# Patient Record
Sex: Male | Born: 1945
Health system: Southern US, Community
[De-identification: ages and names within clinical notes are randomized; demographics above are authoritative.]

## PROBLEM LIST (undated history)

## (undated) DIAGNOSIS — B20 Human immunodeficiency virus [HIV] disease: Secondary | ICD-10-CM

## (undated) DIAGNOSIS — I10 Essential (primary) hypertension: Secondary | ICD-10-CM

## (undated) DIAGNOSIS — E119 Type 2 diabetes mellitus without complications: Secondary | ICD-10-CM

## (undated) DIAGNOSIS — I639 Cerebral infarction, unspecified: Secondary | ICD-10-CM

## (undated) DIAGNOSIS — T8859XA Other complications of anesthesia, initial encounter: Secondary | ICD-10-CM

## (undated) DIAGNOSIS — D649 Anemia, unspecified: Secondary | ICD-10-CM

## (undated) DIAGNOSIS — R7303 Prediabetes: Secondary | ICD-10-CM

## (undated) DIAGNOSIS — C7931 Secondary malignant neoplasm of brain: Secondary | ICD-10-CM

## (undated) DIAGNOSIS — F17290 Nicotine dependence, other tobacco product, uncomplicated: Secondary | ICD-10-CM

## (undated) DIAGNOSIS — I719 Aortic aneurysm of unspecified site, without rupture: Secondary | ICD-10-CM

## (undated) DIAGNOSIS — M1990 Unspecified osteoarthritis, unspecified site: Secondary | ICD-10-CM

## (undated) DIAGNOSIS — C801 Malignant (primary) neoplasm, unspecified: Secondary | ICD-10-CM

## (undated) DIAGNOSIS — S68111A Complete traumatic metacarpophalangeal amputation of left index finger, initial encounter: Secondary | ICD-10-CM

## (undated) DIAGNOSIS — E782 Mixed hyperlipidemia: Secondary | ICD-10-CM

## (undated) DIAGNOSIS — F419 Anxiety disorder, unspecified: Secondary | ICD-10-CM

## (undated) DIAGNOSIS — K219 Gastro-esophageal reflux disease without esophagitis: Secondary | ICD-10-CM

## (undated) DIAGNOSIS — R918 Other nonspecific abnormal finding of lung field: Secondary | ICD-10-CM

## (undated) DIAGNOSIS — J449 Chronic obstructive pulmonary disease, unspecified: Secondary | ICD-10-CM

## (undated) HISTORY — DX: Malignant (primary) neoplasm, unspecified: C80.1

## (undated) HISTORY — PX: TONSILLECTOMY: SUR1361

## (undated) HISTORY — PX: LUMBAR DISC SURGERY: SHX700

## (undated) HISTORY — DX: Type 2 diabetes mellitus without complications: E11.9

## (undated) HISTORY — PX: COLONOSCOPY: SHX174

## (undated) HISTORY — PX: CERVICAL SPINE SURGERY: SHX589

## (undated) MED ORDER — HEPARIN SODIUM (PORCINE) 5000 UNIT/ML IJ SOLN
5000.00 [IU] | Freq: Three times a day (TID) | INTRAMUSCULAR | Status: AC
Start: 2017-12-07 — End: ?

---

## 2003-02-20 ENCOUNTER — Other Ambulatory Visit (INDEPENDENT_AMBULATORY_CARE_PROVIDER_SITE_OTHER): Payer: Self-pay | Admitting: Emergency Medicine

## 2003-02-20 LAB — URINALYSIS
Ketones: NEGATIVE
Nitrite: NEGATIVE
Specific Gravity: 1.013 (ref 1.002–1.030)
Urobilinogen: 0.2 (ref 0.2–?)
pH: 5.5 (ref 5.0–8.0)

## 2017-07-21 DIAGNOSIS — I639 Cerebral infarction, unspecified: Secondary | ICD-10-CM

## 2017-07-21 HISTORY — DX: Cerebral infarction, unspecified (CMS-HCC): I63.9

## 2017-09-19 ENCOUNTER — Other Ambulatory Visit: Payer: Self-pay

## 2017-09-19 DIAGNOSIS — R200 Anesthesia of skin: Secondary | ICD-10-CM

## 2017-09-30 ENCOUNTER — Encounter: Payer: Self-pay | Admitting: Vascular Surgery

## 2017-09-30 ENCOUNTER — Ambulatory Visit (HOSPITAL_COMMUNITY)
Admission: RE | Admit: 2017-09-30 | Discharge: 2017-09-30 | Disposition: A | Discharge status: Home / Self Care | Payer: Medicare Other | Source: Ambulatory Visit | Attending: Vascular Surgery | Admitting: Vascular Surgery

## 2017-09-30 ENCOUNTER — Ambulatory Visit: Payer: Medicare Other | Admitting: Vascular Surgery

## 2017-09-30 VITALS — BP 151/73 | HR 58 | Temp 97.6°F | Resp 16 | Ht 69.0 in | Wt 167.0 lb

## 2017-09-30 DIAGNOSIS — R200 Anesthesia of skin: Secondary | ICD-10-CM | POA: Insufficient documentation

## 2017-09-30 NOTE — Progress Notes (Signed)
[facility] [place]  Patient name: Nathan Turner MRN: [id number] DOB: 08-Jul-1945 Sex: male  REASON FOR CONSULT: Evaluate lower extremity numbness and aching  HPI: Nathan Turner is a 72 y.o. male, who is here today for evaluation of aching and numbness in his lower extremities.  He reports that this is in his calves and is a 2-4 on a scale of 1-10 of discomfort.  This is not related to exercise.  He reports this can occur while he is sitting standing or lying down or he when he is walking.  He reports this been present for several months.  He did report an episode earlier of clearing brush and had a back injury associated with this which was improved with chiropractic manipulation.  Past Medical History:  Diagnosis Date  . Cancer ([place])   . Diabetes mellitus without complication ([place])     History reviewed. No pertinent family history.  SOCIAL HISTORY: Social History   Socioeconomic History  . Marital status: Married    Spouse name: Not on file  . Number of children: Not on file  . Years of education: Not on file  . Highest education level: Not on file  Occupational History  . Not on file  Social Needs  . Financial resource strain: Not on file  . Food insecurity:    Worry: Not on file    Inability: Not on file  . Transportation needs:    Medical: Not on file    Non-medical: Not on file  Tobacco Use  . Smoking status: Current Every Day Smoker    Packs/day: 1.00    Years: 64.00    Pack years: 64.00    Types: Cigarettes  . Smokeless tobacco: Never Used  Substance and Sexual Activity  . Alcohol use: Never    Frequency: Never  . Drug use: Never  . Sexual activity: Yes  Lifestyle  . Physical activity:    Days per week: Not on file    Minutes per session: Not on file  . Stress: Not on file  Relationships  . Social connections:    Talks on phone: Not on file    Gets together: Not on file    Attends  religious service: Not on file    Active member of club or organization: Not on file    Attends meetings of clubs or organizations: Not on file    Relationship status: Not on file  . Intimate partner violence:    Fear of current or ex partner: Not on file    Emotionally abused: Not on file    Physically abused: Not on file    Forced sexual activity: Not on file  Other Topics Concern  . Not on file  Social History Narrative  . Not on file    No Known Allergies  Current Outpatient Medications  Medication Sig Dispense Refill  . aspirin EC 81 MG tablet Take 81 mg by mouth daily.    . citalopram (CELEXA) 20 MG tablet Take 20 mg by mouth daily.    Marland Kitchen diltiazem (CARDIZEM) 120 MG tablet Take 120 mg by mouth 1 day or 1 dose.    . lovastatin (MEVACOR) 20 MG tablet Take 20 mg by mouth every morning.    Marland Kitchen rOPINIRole (REQUIP) 1 MG tablet Take 1 mg by mouth at bedtime.    . temazepam (RESTORIL) 15 MG capsule Take 15 mg by mouth at bedtime as needed for sleep.    Marland Kitchen  traMADol (ULTRAM) 50 MG tablet Take by mouth every 6 (six) hours as needed.     No current facility-administered medications for this visit.     REVIEW OF SYSTEMS:  [X]  denotes positive finding, [ ]  denotes negative finding Cardiac  Comments:  Chest pain or chest pressure:    Shortness of breath upon exertion: x   Short of breath when lying flat:    Irregular heart rhythm:        Vascular    Pain in calf, thigh, or hip brought on by ambulation: x   Pain in feet at night that wakes you up from your sleep:  x   Blood clot in your veins:    Leg swelling:         Pulmonary    Oxygen at home:    Productive cough:     Wheezing:         Neurologic    Sudden weakness in arms or legs:  x   Sudden numbness in arms or legs:  x   Sudden onset of difficulty speaking or slurred speech:    Temporary loss of vision in one eye:  x   Problems with dizziness:  x       Gastrointestinal    Blood in stool:     Vomited blood:           Genitourinary    Burning when urinating:     Blood in urine:        Psychiatric    Major depression:         Hematologic    Bleeding problems:    Problems with blood clotting too easily:        Skin    Rashes or ulcers:        Constitutional    Fever or chills:      PHYSICAL EXAM: Vitals:   09/30/17 1157 09/30/17 1158  BP: (!) 153/85 (!) 151/73  Pulse: (!) 58   Resp: 16   Temp: 97.6 F (36.4 C)   SpO2: 96%   Weight: 167 lb (75.8 kg)   Height: 5\' 9"  (1.753 m)     GENERAL: The patient is a well-nourished male, in no acute distress. The vital signs are documented above. CARDIOVASCULAR: 2+ radial and 2+ dorsalis pedis pulses bilaterally. PULMONARY: There is good air exchange  ABDOMEN: Soft and non-tender  MUSCULOSKELETAL: There are no major deformities or cyanosis. NEUROLOGIC: No focal weakness or paresthesias are detected. SKIN: There are no ulcers or rashes noted. PSYCHIATRIC: The patient has a normal affect.  DATA:  Noninvasive studies were reviewed with the patient and his wife present.  This shows triphasic waveforms in the posterior tibial and dorsalis pedis bilaterally.  He has normal ankle arm index bilaterally  MEDICAL ISSUES: Discussed the clinic with the patient.  I am unclear as to the cause of his lower extremity discomfort.  I explained that he has no evidence of arterial insufficiency to explain this.  He was relieved with this discussion and will continue his usual activities.  We will seek further evaluation if this persists.  Does appear more neurologic than orthopedic.   Rosetta Posner, MD FACS [facility] Tel [phone] Pager [phone]

## 2017-12-07 ENCOUNTER — Emergency Department (HOSPITAL_COMMUNITY)

## 2017-12-07 ENCOUNTER — Emergency Department (HOSPITAL_BASED_OUTPATIENT_CLINIC_OR_DEPARTMENT_OTHER)

## 2017-12-07 ENCOUNTER — Encounter (HOSPITAL_COMMUNITY): Payer: Self-pay

## 2017-12-07 ENCOUNTER — Inpatient Hospital Stay
Admission: EM | Admit: 2017-12-07 | Discharge: 2017-12-09 | DRG: 683 | Disposition: A | Discharge status: Other Acute Inpatient Hospital | Attending: Hospitalist | Admitting: Hospitalist

## 2017-12-07 DIAGNOSIS — R531 Weakness: Secondary | ICD-10-CM

## 2017-12-07 DIAGNOSIS — L89219 Pressure ulcer of right hip, unspecified stage: Secondary | ICD-10-CM | POA: Diagnosis present

## 2017-12-07 DIAGNOSIS — K5900 Constipation, unspecified: Secondary | ICD-10-CM

## 2017-12-07 DIAGNOSIS — M4609 Spinal enthesopathy, multiple sites in spine: Secondary | ICD-10-CM

## 2017-12-07 DIAGNOSIS — Z8673 Personal history of transient ischemic attack (TIA), and cerebral infarction without residual deficits: Secondary | ICD-10-CM

## 2017-12-07 DIAGNOSIS — M6282 Rhabdomyolysis: Secondary | ICD-10-CM | POA: Diagnosis present

## 2017-12-07 DIAGNOSIS — B964 Proteus (mirabilis) (morganii) as the cause of diseases classified elsewhere: Secondary | ICD-10-CM | POA: Diagnosis present

## 2017-12-07 DIAGNOSIS — N400 Enlarged prostate without lower urinary tract symptoms: Secondary | ICD-10-CM | POA: Diagnosis present

## 2017-12-07 DIAGNOSIS — B379 Candidiasis, unspecified: Secondary | ICD-10-CM | POA: Diagnosis present

## 2017-12-07 DIAGNOSIS — L89319 Pressure ulcer of right buttock, unspecified stage: Secondary | ICD-10-CM | POA: Diagnosis present

## 2017-12-07 DIAGNOSIS — W01198A Fall on same level from slipping, tripping and stumbling with subsequent striking against other object, initial encounter: Secondary | ICD-10-CM | POA: Diagnosis present

## 2017-12-07 DIAGNOSIS — Z794 Long term (current) use of insulin: Secondary | ICD-10-CM

## 2017-12-07 DIAGNOSIS — Z87891 Personal history of nicotine dependence: Secondary | ICD-10-CM

## 2017-12-07 DIAGNOSIS — Z9181 History of falling: Secondary | ICD-10-CM

## 2017-12-07 DIAGNOSIS — R9082 White matter disease, unspecified: Secondary | ICD-10-CM

## 2017-12-07 DIAGNOSIS — Z0389 Encounter for observation for other suspected diseases and conditions ruled out: Secondary | ICD-10-CM

## 2017-12-07 DIAGNOSIS — Y92000 Kitchen of unspecified non-institutional (private) residence as  the place of occurrence of the external cause: Secondary | ICD-10-CM

## 2017-12-07 DIAGNOSIS — R7881 Bacteremia: Secondary | ICD-10-CM | POA: Diagnosis present

## 2017-12-07 DIAGNOSIS — N179 Acute kidney failure, unspecified: Principal | ICD-10-CM | POA: Diagnosis present

## 2017-12-07 DIAGNOSIS — W19XXXA Unspecified fall, initial encounter: Secondary | ICD-10-CM

## 2017-12-07 DIAGNOSIS — B20 Human immunodeficiency virus [HIV] disease: Secondary | ICD-10-CM | POA: Diagnosis present

## 2017-12-07 DIAGNOSIS — E860 Dehydration: Secondary | ICD-10-CM | POA: Diagnosis present

## 2017-12-07 DIAGNOSIS — I10 Essential (primary) hypertension: Secondary | ICD-10-CM | POA: Diagnosis present

## 2017-12-07 DIAGNOSIS — E118 Type 2 diabetes mellitus with unspecified complications: Secondary | ICD-10-CM

## 2017-12-07 DIAGNOSIS — S80211A Abrasion, right knee, initial encounter: Secondary | ICD-10-CM | POA: Diagnosis present

## 2017-12-07 DIAGNOSIS — I69351 Hemiplegia and hemiparesis following cerebral infarction affecting right dominant side: Secondary | ICD-10-CM

## 2017-12-07 DIAGNOSIS — L89159 Pressure ulcer of sacral region, unspecified stage: Secondary | ICD-10-CM | POA: Diagnosis present

## 2017-12-07 DIAGNOSIS — S80212A Abrasion, left knee, initial encounter: Secondary | ICD-10-CM

## 2017-12-07 DIAGNOSIS — R627 Adult failure to thrive: Secondary | ICD-10-CM | POA: Diagnosis present

## 2017-12-07 DIAGNOSIS — M4186 Other forms of scoliosis, lumbar region: Secondary | ICD-10-CM

## 2017-12-07 DIAGNOSIS — M5137 Other intervertebral disc degeneration, lumbosacral region: Secondary | ICD-10-CM

## 2017-12-07 DIAGNOSIS — M549 Dorsalgia, unspecified: Secondary | ICD-10-CM

## 2017-12-07 DIAGNOSIS — G9389 Other specified disorders of brain: Secondary | ICD-10-CM

## 2017-12-07 DIAGNOSIS — E11622 Type 2 diabetes mellitus with other skin ulcer: Secondary | ICD-10-CM | POA: Diagnosis present

## 2017-12-07 HISTORY — DX: Human immunodeficiency virus (HIV) disease (CMS-HCC): B20

## 2017-12-07 HISTORY — DX: Essential (primary) hypertension: I10

## 2017-12-07 HISTORY — DX: Cerebral infarction, unspecified (CMS-HCC): I63.9

## 2017-12-07 HISTORY — DX: Type 2 diabetes mellitus without complications (CMS-HCC): E11.9

## 2017-12-07 LAB — CBC WITH DIFF, BLOOD
ANC-Automated: 5.9 10*3/uL (ref 1.6–7.0)
Abs Basophils: 0 10*3/uL (ref <0.1)
Abs Eosinophils: 0.1 10*3/uL (ref 0.1–0.5)
Abs Lymphs: 1.1 10*3/uL (ref 0.8–3.1)
Abs Monos: 0.6 10*3/uL (ref 0.2–0.8)
Basophils: 0 %
Eosinophils: 1 %
Hct: 57.3 % — ABNORMAL HIGH (ref 40.0–50.0)
Hgb: 19.6 gm/dL (ref 13.7–17.5)
Lymphocytes: 15 %
MCH: 33.4 pg — ABNORMAL HIGH (ref 26.0–32.0)
MCHC: 34.2 g/dL (ref 32.0–36.0)
MCV: 97.8 um3 — ABNORMAL HIGH (ref 79.0–95.0)
MPV: 11.3 fL (ref 9.4–12.4)
Monocytes: 7 %
Plt Count: 107 10*3/uL — ABNORMAL LOW (ref 140–370)
RBC: 5.86 10*6/uL (ref 4.60–6.10)
RDW: 12.7 % (ref 12.0–14.0)
Segs: 77 %
WBC: 7.7 10*3/uL (ref 4.0–10.0)

## 2017-12-07 LAB — URINALYSIS WITH CULTURE REFLEX, WHEN INDICATED
Bilirubin: NEGATIVE
Glucose: NEGATIVE
Leuk Esterase: NEGATIVE
Nitrite: NEGATIVE
Protein: NEGATIVE
Specific Gravity: 1.023 (ref 1.002–1.030)
pH: 6 (ref 5.0–8.0)

## 2017-12-07 LAB — CPK-CREATINE PHOSPHOKINASE, BLOOD
CPK: 8679 U/L (ref 0–175)
CPK: 7749 U/L (ref 0–175)

## 2017-12-07 LAB — COMPREHENSIVE METABOLIC PANEL, BLOOD
ALT (SGPT): 90 U/L — ABNORMAL HIGH (ref 0–41)
AST (SGOT): 214 U/L — ABNORMAL HIGH (ref 0–40)
Albumin: 4.2 g/dL (ref 3.5–5.2)
Alkaline Phos: 106 U/L (ref 40–129)
Anion Gap: 15 mmol/L (ref 7–15)
BUN: 62 mg/dL — ABNORMAL HIGH (ref 8–23)
Bicarbonate: 30 mmol/L — ABNORMAL HIGH (ref 22–29)
Bilirubin, Tot: 2.26 mg/dL — ABNORMAL HIGH (ref <1.2)
Calcium: 10.2 mg/dL (ref 8.5–10.6)
Chloride: 100 mmol/L (ref 98–107)
Creatinine: 1.53 mg/dL — ABNORMAL HIGH (ref 0.67–1.17)
GFR: 54 mL/min
Glucose: 196 mg/dL — ABNORMAL HIGH (ref 70–99)
Potassium: 4.8 mmol/L (ref 3.5–5.1)
Sodium: 145 mmol/L (ref 136–145)
Total Protein: 10 g/dL — ABNORMAL HIGH (ref 6.0–8.0)

## 2017-12-07 LAB — PRO BNP, BLOOD: BNPP: 69 pg/mL (ref 0–899)

## 2017-12-07 LAB — TROPONIN T GEN 5 W/REFLEX TO CK/CKMB
Troponin T Gen 5 w/Reflex CK/CKMB: 28 ng/L — ABNORMAL HIGH (ref <22)
Troponin T Gen 5 w/Reflex CK/CKMB: 23 ng/L — ABNORMAL HIGH (ref <22)

## 2017-12-07 LAB — CKMB+INDEX (NO CPK), BLOOD
CK-MB Index: 0.3 %
CK-MB Index: 0.2 %
CK-MB: 30 ng/mL — ABNORMAL HIGH (ref 0.0–4.8)
CK-MB: 16 ng/mL — ABNORMAL HIGH (ref 0.0–4.8)

## 2017-12-07 LAB — HCV ANTIBODY WITH REFLEX QUANT: Hepatitis C Ab: NONREACTIVE

## 2017-12-07 LAB — MAGNESIUM, BLOOD: Magnesium: 3.3 mg/dL — ABNORMAL HIGH (ref 1.6–2.4)

## 2017-12-07 LAB — PHOSPHORUS, BLOOD: Phosphorous: 4.7 mg/dL — ABNORMAL HIGH (ref 2.7–4.5)

## 2017-12-07 LAB — LACTATE, BLOOD
Lactate: 2.2 mmol/L — ABNORMAL HIGH (ref 0.5–2.0)
Lactate: 3.1 mmol/L — ABNORMAL HIGH (ref 0.5–2.0)

## 2017-12-07 LAB — PROTHROMBIN TIME, BLOOD
INR: 1.1
PT,Patient: 12.4 s (ref 9.7–12.5)

## 2017-12-07 LAB — GLUCOSE (POCT): Glucose (POCT): 202 mg/dL — ABNORMAL HIGH (ref 70–99)

## 2017-12-07 LAB — APTT, BLOOD: PTT: 28 s (ref 25–34)

## 2017-12-07 MED ORDER — NALOXONE HCL 0.4 MG/ML IJ SOLN
0.1000 mg | INTRAMUSCULAR | Status: DC | PRN
Start: 2017-12-07 — End: 2017-12-09

## 2017-12-07 MED ORDER — SODIUM CHLORIDE 0.9 % IV SOLN
INTRAVENOUS | Status: AC
Start: 2017-12-07 — End: 2017-12-08
  Administered 2017-12-07: 20:00:00 via INTRAVENOUS

## 2017-12-07 MED ORDER — NYSTATIN 100000 UNIT/ML MT SUSP
5.0000 mL | Freq: Four times a day (QID) | OROMUCOSAL | Status: DC
Start: 2017-12-07 — End: 2017-12-09
  Administered 2017-12-07 – 2017-12-09 (×7): 500000 [IU] via ORAL
  Filled 2017-12-07 (×7): qty 5

## 2017-12-07 MED ORDER — SODIUM CHLORIDE 0.9% TKO INFUSION
INTRAVENOUS | Status: DC | PRN
Start: 2017-12-07 — End: 2017-12-09

## 2017-12-07 MED ORDER — SODIUM CHLORIDE 0.9 % IJ SOLN (CUSTOM)
3.0000 mL | Freq: Three times a day (TID) | INTRAMUSCULAR | Status: DC
Start: 2017-12-07 — End: 2017-12-09
  Administered 2017-12-08 – 2017-12-09 (×5): 3 mL via INTRAVENOUS

## 2017-12-07 MED ORDER — LACTATED RINGERS IV SOLN
Freq: Once | INTRAVENOUS | Status: AC
Start: 2017-12-07 — End: 2017-12-07
  Administered 2017-12-07: 17:00:00 via INTRAVENOUS

## 2017-12-07 MED ORDER — DEXTROSE (DIABETIC USE) 40 % OR GEL
1.0000 | ORAL | Status: DC | PRN
Start: 2017-12-07 — End: 2017-12-09

## 2017-12-07 MED ORDER — INSULIN LISPRO (HUMAN) 100 UNIT/ML SC SOLN (CUSTOM)
1.0000 [IU] | Freq: Three times a day (TID) | INTRAMUSCULAR | Status: DC
Start: 2017-12-07 — End: 2017-12-09
  Administered 2017-12-07 – 2017-12-08 (×3): 1 [IU] via SUBCUTANEOUS
  Administered 2017-12-08 – 2017-12-09 (×2): 2 [IU] via SUBCUTANEOUS
  Filled 2017-12-07 (×3): qty 1
  Filled 2017-12-07 (×2): qty 2

## 2017-12-07 MED ORDER — GLUCOSE 4 GM PO CHEW (CUSTOM)
4.0000 | CHEWABLE_TABLET | ORAL | Status: DC | PRN
Start: 2017-12-07 — End: 2017-12-09

## 2017-12-07 MED ORDER — LACTATED RINGERS IV SOLN
Freq: Once | INTRAVENOUS | Status: AC
Start: 2017-12-07 — End: 2017-12-07
  Administered 2017-12-07: 15:00:00 via INTRAVENOUS

## 2017-12-07 MED ORDER — SENNA 8.6 MG OR TABS
2.0000 | ORAL_TABLET | Freq: Every evening | ORAL | Status: DC
Start: 2017-12-07 — End: 2017-12-09
  Filled 2017-12-07 (×2): qty 2

## 2017-12-07 MED ORDER — HEPARIN SODIUM (PORCINE) 5000 UNIT/ML IJ SOLN
5000.0000 [IU] | Freq: Three times a day (TID) | INTRAMUSCULAR | Status: DC
Start: 2017-12-07 — End: 2017-12-09
  Administered 2017-12-07 – 2017-12-09 (×7): 5000 [IU] via SUBCUTANEOUS
  Filled 2017-12-07 (×6): qty 1

## 2017-12-07 MED ORDER — HEPARIN SODIUM (PORCINE) 5000 UNIT/ML IJ SOLN
5000.0000 [IU] | Freq: Two times a day (BID) | INTRAMUSCULAR | Status: DC
Start: 2017-12-07 — End: 2017-12-07

## 2017-12-07 MED ORDER — POLYETHYLENE GLYCOL 3350 OR PACK
17.0000 g | PACK | ORAL | Status: DC | PRN
Start: 2017-12-07 — End: 2017-12-09

## 2017-12-07 MED ORDER — DEXTROSE 50 % IV SOLN
12.5000 g | INTRAVENOUS | Status: DC | PRN
Start: 2017-12-07 — End: 2017-12-09

## 2017-12-07 MED ORDER — TRAMADOL HCL 50 MG OR TABS
50.0000 mg | ORAL_TABLET | ORAL | Status: DC | PRN
Start: 2017-12-07 — End: 2017-12-09

## 2017-12-07 MED ORDER — LACTATED RINGERS IV SOLN
Freq: Once | INTRAVENOUS | Status: AC
Start: 2017-12-07 — End: 2017-12-07
  Administered 2017-12-07: 12:00:00 via INTRAVENOUS

## 2017-12-07 MED ORDER — SODIUM CHLORIDE 0.9 % IJ SOLN (CUSTOM)
3.0000 mL | INTRAMUSCULAR | Status: DC | PRN
Start: 2017-12-07 — End: 2017-12-09

## 2017-12-07 MED ORDER — GLUCAGON HCL (RDNA) 1 MG IJ SOLR
1.0000 mg | Freq: Once | INTRAMUSCULAR | Status: DC | PRN
Start: 2017-12-07 — End: 2017-12-09

## 2017-12-07 NOTE — ED Notes (Signed)
Pt drank 3-glasses of water & eating a sandwich.

## 2017-12-07 NOTE — H&P (Signed)
Medicine Admission Note    Patient Name: Brendan Huerta        MRN: [id number]  Admitted: 12/07/2017 11:17 AM     Patient ID: Brendan Huerta is a 72 year old male with a PMH significant for DM2, HTN, CVAx3 (07/2017 most recent), and HIV (unknown CD4 and Viral Load) who presents with likely rhabdomyolysis and AKI d/t severe volume depletion after a ground level fall without food or drink for 1 week, right hip and buttocks pressure ulcers.     CC:  Severe Volume Depletion causing AKI and Rhabdomyolysis     HPI:      Patient reports that he was trying to get food from the fridge when he felt dizzy, tripped, and fell about 7-8 days ago, he believes it was last Saturday (8/10). He reports hitting his head on the way down. After falling, he tried to crawl to his door but was unable to. The patient's neighbor noticed his mail building up, became concerned, went to check on him and found him down. Eemergency responders found the patient down in his home lying in urine. The patient reports similar dizzy symptoms about once per month.    The patient reports a history of 3 strokes, the most recent in April 2019, with residual right sided weakness. The patient uses a cane to ambulate outside the house, he was not using a cane at the time of his fall.  He lives alone without a caregiver and receives VA benefits that covers his cost of living.    History limited by memory and concentration        ED events:  Fluid Resuscitation: 3L LRs  CT Head: No acute findings   CXR: No consolidation, effusion, or signs of trauma  U/A: Positive for trace ketones, 2+ blood and 2+ Urobilinogen   EKG: Nonspecific ST Changes   Labs: See below     General ROS:   General: Mild Headache, denies fevers, chills, night sweats, recent weight change  Eyes: Denies blurry vision, eye pain  Ears: Denies ringing in ears, ear pain  Cardio: Denies chest pain, palpitations, orthopnea,   Pulm: Reports SOB and cough. Denies hemoptysis,   GI: Reports blood in stool  2-3/month surround the stool. Unsure if there is blood on the toilet paper. Last BM last Tuesday. Denies dysphagia, n/v/d/c, melena.   GU: Reports hematuria 1 time/month, polyuria. Denies dysuria, nocturia (1/night),    Neuro: See H&P. Denies numbness and tingling in extremities.     Past Medical and Surgical History:  Past Medical History:   Diagnosis Date   . CVA (cerebral vascular accident) (CMS-HCC) 07/2017    Left sided stroke, right motor dysfunction    . CVA (cerebral vascular accident) (CMS-HCC)     3 total   . DM2 (diabetes mellitus, type 2) (CMS-HCC)    . HIV disease (CMS-HCC)    . HTN (hypertension)      No past surgical history on file.    Social History: Patient lives by himself without a caregiver  Social History     Socioeconomic History   . Marital status: Divorced     Spouse name: Not on file   . Number of children: 1   . Years of education: Not on file   . Highest education level: Not on file   Occupational History   . Occupation: Former Building control surveyor   Tobacco Use   . Smoking status: Former Smoker     Packs/day: 4.00  Years: 37.00     Pack years: 148.00     Types: Cigarettes     Last attempt to quit: 1997     Years since quitting: 22.6   . Smokeless tobacco: Never Used   Substance and Sexual Activity   . Alcohol use: Not Currently     Frequency: 4 or more times a week     Drinks per session: 10 or more     Comment: Stopped drinking in 1996   . Drug use: Not Currently     Comment: Former marijuana, cocaine, crystal meth    . Sexual activity: Not Currently     Comment: Formerly sexually active with women   Social Activities of Daily Living Present   . Military Service Yes   . Blood Transfusions Not Asked   . Caffeine Concern Yes   . Occupational Exposure Not Asked   . Hobby Hazards Not Asked   . Sleep Concern No   . Stress Concern Not Asked   . Weight Concern Not Asked   . Special Diet Not Asked   . Back Care Not Asked   . Exercises Regularly Not Asked   . Bike Helmet Use Not Asked   . Seat Belt Use Not  Asked   . Performs Self-Exams Not Asked   Social History Narrative   . Not on file       Family History:   Patient was unsure of his family medical history    Medications:  Patient is unsure of his home medications    Allergies:  Patient is unsure of his allergies   ---------------------------------------------------------------------------------------------------  Objective:  Vitals:  BP  Min: 129/78  Max: 177/81  Temp  Min: 97.3 F (36.3 C)  Max: 98.2 F (36.8 C)  Pulse  Min: 90  Max: 105  Resp  Min: 16  Max: 22  SpO2  Min: 90 %  Max: 100 %  Weight  Min: 92.4 kg (203 lb 12.8 oz)  Max: 92.4 kg (203 lb 12.8 oz)    Weights (last 14 days)     Date/Time Weight Weight Source Percentage Weight Change (%) Who    12/07/17 1128  92.4 kg (203 lb 12.8 oz)  Bed scale  0 % AE            Previous dry weight:     Physical Exam:  Gen: Patient lying in bed comfortably, NAD, difficulty staying focused on conversation, cooperative  HEENT: Hazy Cataracts, injected sclera, PERRL 3->2, EOMI, OP   Neck: Supple, no LAD, JVP flat  CV:  Tachycardic and regular rhythm, no murmurs, clicks, or gallops.  Resp: Patient could not sit up without pain, clear to auscultation of lateral and anterior lung fields, normal work of breathing, shallow breaths.  Abdomen: BS normal.  Abdomen soft, non-tender.  No masses or organomegaly appreciated.  Extremities:  No lower extremity edema, warm and well perfused, some skin turgor  Neuro: A&Ox3 (Knew month and year, thought it was Saturday did not know number date). CN II-XII intact.  RUE 4/5 strength with shoulder Abduction, flexion/extension at elbow.   LUE 4+/5 Strength with shoulder abduction, flexion/extension at elbow  Grip strength roughly symmetric  LE neuro exam limited by pain.  RLE 2+/5 strength hip flexors, could not test knee d/t pain, able to see muscles of quads contracting, minimally able to wiggle toes   LLE 3/5 strength hip flexors, minimal antigravity, able to see muscle of quads  contracting, able to wiggle toes  and dorsiflex.  Right Biceps reflex 3+, L biceps reflex 2+. Differed LE reflexes d/t pain. Hoffman positive RUE, negative on left. Babinski on right leg limited by plantar pain.  Crude sensory exam equivalent bilaterally.     Pertinent labs (see EPIC for complete list):     Recent Labs     12/07/17  1206   NA 145   K 4.8   CL 100   BICARB 30*   BUN 62*   CREAT 1.53*   [place] 10.2   MG 3.3*   PHOS 4.7*   TP 10.0*   ALB 4.2   TBILI 2.26*   AST 214*   ALT 90*   ALK 106       Recent Labs     12/07/17  1206   WBC 7.7   HGB 19.6*   HCT 57.3*   MCV 97.8*   PLT 107*   SEG 77   LYMPHS 15   MONOS 7   EOS 1       Recent Labs     12/07/17  1206   PTT 28   INR 1.1     Lactate 8/18:  2.2 -> 3.1    Troponin 8/18  28 -> 23    CPK 8/18:   8,679 -> 7,749    Urinalysis  pH 6.0 (08/18) Spec Grav 1.023 (08/18) Glucose Negative (08/18) Ketones Trace* (08/18)   Blood 2+* (08/18) Protein Negative (08/18) Urobilinogen 2+* (08/18) Leuk Est Negative (08/18)   Nitrite Negative (08/18) WBCs 0-2 (08/18) RBC 3-5* (08/18) Bacteria Rare (08/18)   Epith Cells   Crystals   Comments        TP 10.0* (08/18) ALT 90* (08/18) TBILI 2.26* (08/18) ALK PHOS  106 (08/18)   ALB 4.2 (08/18) AST 214* (08/18) DBILI          Micro:  Blood Culture 8/18: Pending    Interval Imaging / Procedures / Studies:    X-ray Knee, Lumbosacral Spin, Thoracic Spine: 8/18  IMPRESSION:  No acute bone or joint injury seen about the left knee, thoracic spine or lumbar spine/pelvis.  Degenerative changes as described above.    X-ray Chest: 8/18  IMPRESSION:  No radiographic evidence of acute injury to the chest.    CT Head w/out contrast: 8/18  FINDINGS:  No acute intracranial hemorrhage, mass effect, midline shift or extra-axial fluid collection.  Parenchymal volume is age-appropriate with proportional CSF spaces. Encephalomalacia and focal calcification in the left basal ganglia likely due to old hypertensive hemorrhage in this region.  Encephalomalacia/gliosis also seen in the bilateral cerebral hemispheres (greater on the right), likely representing sequelae of chronic infarct in the bilateral PICA, and also right AICA and SCA territories. There is mild periventricular and subcortical white matter tract hypodensities consistent with chronic microangiopathic and/or hypertensive change. The basal cisterns are patent.  IMPRESSION:  No acute intracranial hemorrhage, midline shift, hydrocephalus or displaced skull fracture.  Regions of encephalomalacia/gliosis as described above. Mild hypertensive white matter change.    ________________________________________________________________________  Assessment and Care Plan:   Mr. Chrostowski is a 72 year old male with a PMH significant for DM2, HTN, CVAx3 (07/2017 most recent), and HIV (unknown CD4 and Viral Load) who presents with likely rhabdomyolysis and AKI d/t severe volume depletion after a ground level fall without food or drink for 1 week, right sided pressure ulcers. Patient stable, volume resuscitating, and repeating acute labs in the AM. Working up status of chronic conditions, HIV and DM2.    #  Elevated Lactate: Likely due to hypoperfusion 2/2 severe volume depletion and likely rhabdomyolysis, s/p 3L LR in the ED. Infection unlikely source: patient afebrile, no leukocytosis (may be d/t HIV), no tachypnea, and tachycardia explained by low fluid status. CXR shows no consolidation or effusion, pulmonary infection unlikely. U/A negative for LE and nitrites, UTI unlikely. Denies n/v/d/c, very low clinical suspicion of gastroenteritis.   - Overnight NS infusion & 24m/hr  - Trend lactate  [ ]  f/u blood culture    #AKI vs CKD: Elevated creatinine 1.53 with unknown baseline. Last BMP in EMR shows a single day elevated and down trending creatinine 2.4 -> 2.2 (02/20/03), unclear what caused previous hospitalization, may have had an AKI at the time. Top differential is AKI given volume depletion and clinical  scenario, however patient with DM2 and elevated sugar despite 1 week fast, elevated creatine may be due to diabetic nephropathy. Bicarb elevated to 30 in setting of elevated lactate. Possible lactic acidosis w/anion gap of 15, if so, appears kidney is compensating well and argues against CKD. Bicarb elevation may be 2/2 concentrated low fluid status.  - Daily BMP to trend Creatine and Lytes     - Strict I/O    #Likely Rhabdomyolysis: CPK 8,679 -> 7,749 (s/p fluids) and AST 214, likely caused by severe volume depletion. 2+ blood on U/A likely d/t myoglobinuria, RBC only 3-5.   [ ]  Trend Liver Panel and CPK  [ ]  Repeat CMP to monitor lytes/extracellular shift    #Transaminitis: Likely 2/2 rhabdomyolysis, however, bili elevated to 2.26.   [ ]  F/u D-bili and repeat liver panel     #Pressure Ulcer Right Buttock and right hip: Developed after fall and lying immobile on right side  - Tramadol 516mPRN q4 for pain  [ ]  f/u up with wound team     #DM2: Unknown A1C, glucose 196 after prolonged fast, home regimen unknown at this time  - Insulin sliding scale   [ ]  [facility]    #Hypertension: Hx of hypertension, systolics range between 13948-546Unknown home meds.  - Permissive hypertension to 180 until meds known  - Sustained pressures between 160-180: Monitor for neurologic signs and consider PO hypertensive med such as hydralazine     #Elevated Hemoglobin: Hg 19.6, likely hemoconcentration, however WBC wnl and platelets low at 107. WBC may be falsely normal in setting of HIV and hemoconcentration. Incomplete medical history, another medical condition may be depleting platelets.   - Repeat CBC AM after volume repletion   [ ]  [facility]     #HIV: Unknown CD4 count and viral load, thrush on exam  [ ]  f/u CD4 count and viral load  [ ]  [facility] for [facility]    #Thrush: On exam  - Nystatin QID    #History of CVA: Right hemiplegia: 4/5 strength in RUE and 2+/5 strength in  RLE (limited by pain and extreme deconditioning) compared to 4+/5 strength in LUE and 3/5 strength in LLE. Mild spasticity CT head showed no acute findings   - Monitor for new onset focal neurologic findings  - [facility]     #Troponemia (Resolved): Down trended 28 -> 23, likely due to volume depletion. Patient denies cardiac symptoms, EKG showed nonspecific ST changes.     #Bowel Prep:  -Senna and miralax PRN    #FTT: Patient with HIV, unknown CD4 count, without food or  drink for 1 week, extreme deconditioning and weakness  - Carb limited Diet  - Boost    - Daily BMP to monitor lyte recover  [ ]  F/u social work and case management, will likely need placement  [ ]  Consider Nutrition Consult    FEN:        Carb Limited Diet  Analgesics: Tramadol  Thromboprophylaxis:   SubQ Heparin   Ulcer Prophylaxis: Wound Care Consult  Glucose Management: SSI  Skin:  Pressure ulcer right buttocks and hip  Indwelling Lines: PIV                                         #---   No Order    ---#    The history and exam was performed under the direct supervision of the senior resident, Dr. Cecilie Kicks. The case was discussed in detail and clinical decisions were made in conjuction with Dr. Queen Blossom, the attending of record.     Brendan Huerta, MS4

## 2017-12-07 NOTE — ED Notes (Signed)
Pt a difficult IV stick, unable to insert IV w/ Ultrasound but able to get blood. IV in L hand at this time, infusing well.

## 2017-12-07 NOTE — ED Notes (Signed)
Pt was brought in by EMS after his neighbors started to notice his mail was piling up outside & was concerned & patient was also yelling for help. Pt may have possibly fallen within the last 7-days & he was unable to get to to any food or water, he was able to eat a [employer] candy bar. Pt recently had 2-strokes & has residual R side weakness in arm & leg. Pt has noted dry lips, pressure sores noted to R hip & R buttock, Mepilex applied, and L knee abrasion from fall. Pt c/o lower back pain, denies any CP, SOB or ABD pain.

## 2017-12-07 NOTE — ED MD Progress Note (Signed)
EMERGENCY DEPARTMENT SIGN-OUT NOTE  [facility] electronic medical record has been reviewed for pertinent medical history.     Triage:  Patient arrived to the Emergency Department with complaint of: Falls (per pt was down for approx 7-8x days s/p fall, per pt states, "my R side is incapacitated from a stroke." neighbor noticed his mail was building up and went to check on him, ox3, verbalized was able to reach in his fridge once and eat a candy bar. VSS, per pt has had 2x strokes, 1 in 08/2017 and does not recall the one prior)      History:  Past Medical History:   Diagnosis Date   . HIV disease (CMS-HCC)        No past surgical history on file.    Labs:  Labs Reviewed   CBC WITH DIFF, BLOOD - Abnormal; Notable for the following components:       Result Value    Hgb 19.6 (*)     Hct 57.3 (*)     MCV 97.8 (*)     MCH 33.4 (*)     Plt Count 107 (*)     All other components within normal limits   COMPREHENSIVE METABOLIC PANEL, BLOOD - Abnormal; Notable for the following components:    Glucose 196 (*)     BUN 62 (*)     Creatinine 1.53 (*)     Bicarbonate 30 (*)     Total Protein 10.0 (*)     Bilirubin, Tot 2.26 (*)     AST (SGOT) 214 (*)     ALT (SGPT) 90 (*)     All other components within normal limits   MAGNESIUM, BLOOD - Abnormal; Notable for the following components:    Magnesium 3.3 (*)     All other components within normal limits   PHOSPHORUS, BLOOD - Abnormal; Notable for the following components:    Phosphorous 4.7 (*)     All other components within normal limits   CPK-CREATINE PHOSPHOKINASE, BLOOD - Abnormal; Notable for the following components:    CPK 8,679 (*)     All other components within normal limits   LACTATE, BLOOD - Abnormal; Notable for the following components:    Lactate 2.2 (*)     All other components within normal limits    Narrative:     Nurse may discontinue the second lactate order if the results  of the first order are normal (Lactate <2)   TROPONIN T GEN 5 W/REFLEX TO CK/CKMB - Abnormal;  Notable for the following components:    Troponin T Gen 5 w/Reflex CK/CKMB 28 (*)     All other components within normal limits   CKMB+INDEX (NO CPK), BLOOD - Abnormal; Notable for the following components:    CK-MB 30.0 (*)     All other components within normal limits   LACTATE, BLOOD - Abnormal; Notable for the following components:    Lactate 3.1 (*)     All other components within normal limits    Narrative:     Nurse may discontinue the second lactate order if the results  of the first order are normal (Lactate <2)   PROTHROMBIN TIME, BLOOD   APTT, BLOOD   PRO BNP, BLOOD   URINALYSIS WITH CULTURE REFLEX, WHEN INDICATED   TROPONIN T GEN 5 W/REFLEX TO CK/CKMB   TROPONIN T GEN 5 W/REFLEX TO CK/CKMB   HCV ANTIBODY WITH REFLEX QUANT   BLOOD CULTURE ROUTINE   BLOOD CULTURE ROUTINE  Imaging:  X-Ray Thoracic Spine 2 Views   Final Result   IMPRESSION:   No acute bone or joint injury seen about the left knee, thoracic spine or lumbar spine/pelvis.      Degenerative changes as described above.      X-Ray Lumbosacral Spine 2 Or 3 Views   Final Result   IMPRESSION:   No acute bone or joint injury seen about the left knee, thoracic spine or lumbar spine/pelvis.      Degenerative changes as described above.      X-Ray Knee 1 Or 2 Views - Left   Final Result   IMPRESSION:   No acute bone or joint injury seen about the left knee, thoracic spine or lumbar spine/pelvis.      Degenerative changes as described above.      CT Head W/O Contrast   Preliminary Result   IMPRESSION:   No acute intracranial hemorrhage, midline shift, hydrocephalus or displaced skull fracture.      Regions of encephalomalacia/gliosis as described above. Mild hypertensive white matter change.         X-Ray Chest Single View   Final Result   IMPRESSION:   No radiographic evidence of acute injury to the chest.               Assessment & Plan:  72 year old male presents with Hx of CVA and R-sided weakness, here after being found down for possibly 7 days  with posterior head injury.  Normal orientation here, but has rhabdo and AKI.  Staying in the hospital, getting 3rd liter now.    Additional evaluation and work-up still pending:  [x ] NTD    Dispo Plan: M/S (Medicine)    The Date of Service for the Emergency Room encounter is 12/07/2017 11:17 AM       *This note was dictated with speech-to-text software.  Please excuse any typos, and contact provider directly if questions arise regarding any of the information herein.  ----------------------------------------------------------------------------------------------------------------------      WORK-UP REVIEW:  Workup Review as of Dec 08 1714   Others' Documentation   Sun Dec 07, 2017   1308 CPK 8700    [JS]   1250 Hgb 19    [JS]      Workup Review User Index  [JS] Conchita ParisStorey, John Raymond Avant, MD

## 2017-12-07 NOTE — EMS Narrative (Addendum)
Pt Age: [age] Years; Gender: Male;  Primary Impression: Weakness/Other;  Failure to Thrive    Medical History: ; Pt Medication: ; Pt Allergies: (No Known Drug   Allergies), ;  M18 responded with E4 to private residence for 72 year old male failure to   thrive. PT was found in apartment lying on the kitchen floor covered in his   urine and feces. PT next door neighbor states he noticed PT hasnt gotten   his mail in a week and attempted to check on him, heard Pt yelling for help   in his apartment and activated 911. PT states he was walking through his   kitchen last Saturday     Date/Time: 12/07/2017 10:44:40; Mental Status: Normal Baseline for Patient;   Neuro: Normal Baseline for Patient;     GCS 15 and A/Ox4. PERRL @3mm , skin signs pink warm and dry, lung sounds   clear and equal bilaterally, abdomen soft and non-tender, pelvis stable,   CSMs intact X4, no outward signs of trauma, rest of secondary WNL.     ALS assessment, vital signs to rule out life threatening abnormalities,   SPO2 monitoring, ECG 3-lead to rule out cardiac abnormalities, comfort care   and position of comfort enroute.     PT transported code 50 to [place]. PT assisted into ED gurney with EMS and   hospital staff assistance. Full report given and care passed to ED RN.   Belongings left with ED staff; clothing. No further contacts made.    CC:  Date/Time of Symptom Onset: 2019-08-11T00:00:00-07:00    HPI:  Provider's Primary Impression: Weakness  Initial Patient Acuity: Lower Acuity Ch[place] Si(Green)    Alert:  Patient Care Report Number: [id number]  Incident Number: [id number]  EMS Vehicle (Unit) Number: [id number]  EMS Unit Call Sign: [id number]  Level of Care of This Unit: ALS-Paramedic  Incident Location Type: Unsp non-institutional (private) residence as place  Incident Street Address[address]  [place]: [phone]  Incident ZIP Code: [phone]    Assessment:  Heart Assessment: Normal  Mental Status Assessment: Normal Baseline for Patient    Procedure - Arrest:  Cardiac  Arrest: No    Procedure - Exam:    Procedure - Injury:    Procedure - Airway:    Procedure - Medications:    Procedure - Generic:  Date/Time Procedure Performed: 2019-08-18T10:31:19-07:00  Procedure Performed Prior to this Unit's EMS Care: Yes  Procedure: [id number]    Demographics History:  Pregnancy: No    Demographics Practitioner:    Demographics Patient:  Last Name: Brendan Huerta  First Name: [facility]Charles  Patient's Home Address: [address], [place] [address]  Patient's Home [place]: [phone]  Patient[place]: [phone]  Patient's Home State: 06  Patient's Home ZIP Code: [phone]  Patient's Country of Residence: [place]  Gender: Male  Race: Black or African American  Age: [age]  Age Units: Years    Demographics Times:  Unit Notified by Dispatch Date/Time: 2019-08-18T10:18:59-07:00  Unit En Route Date/Time: 2019-08-18T10:19:14-07:00  Unit Arrived on Scene Date/Time: 2019-08-18T10:30:29-07:00  Arrived at Patient Date/Time: 2019-08-18T10:47:00-07:00  Unit Left Scene Date/Time: 2019-08-18T10:51:51-07:00    Demographics Payment:

## 2017-12-07 NOTE — ED Floor Report (Signed)
ED to IP Handoff    Report created by Oliver HumSara Supriya Beaston, RN at 12:42 PM 12/07/2017.     HANDOFF REPORT UPDATE/CHANGES (changes in patient status/care/events prior to transfer)  By who:  Time:   Additional information:                                                                                                                                                     Brendan NorfolkCharles Sanders Huerta is a 72 year old male.    Brief Summary of ED Visit (to include focused assessment and neuro status):    Pt was brought in by EMS after his neighbors started to notice his mail was piling up outside & was concerned & patient was also yelling for help. Pt may have possibly fallen within the last 7-days & he was unable to get to to any food or water, he was able to eat a [employer] [address] candy bar. Pt recently had 2-strokes & has residual R side weakness in arm & leg. Pt has noted dry lips, pressure sores noted to R hip & R buttock, Mepilex applied, and L knee abrasion from fall. Pt c/o lower back pain, denies any CP, SOB or ABD pain    RN shift assessment exceptions to WDL: Difficult IV stick initially, possibly due to dehydration    Any significant events and interventions with responses:      Radiologic studies not completed:   (None unless otherwise noted)    Chief Complaint   Patient presents with   . Falls     per pt was down for approx 7-8x days s/p fall, per pt states, "my R side is incapacitated from a stroke." neighbor noticed his mail was building up and went to check on him, ox3, verbalized was able to reach in his fridge once and eat a candy bar. VSS, per pt has had 2x strokes, 1 in 08/2017 and does not recall the one prior       Admitted for: Fall    Code Status:  Please refer to In-pt admitting doctors orders     Level of Care: IMU     Is patient septic? no If yes, complete below:    BC x 2 drawn? yes  If No explain:      Repeat lactate needed? no  If Yes, when is it due?      All initial antibiotics given?  no  If No, explain:           Amount of IV fluids received 1000 ml    Is patient on Heparin? no If yes, complete below:     Time Heparin bolus was given:     Additional drips patient is on:     Cardiac rhythm: NSR    Oxygen Delivery: None    No past medical history  on file.    No past surgical history on file.    Allergies: Patient has no known allergies.    ED Fall Risk: (!) Yes    Skin issues:  yes    >> If yes, note areas of skin breakdown. See appropriate photos.      Ambulatory:  no    Sitter needed: no    Suicide Risk:  no    Isolation Required: no     >> If yes , what type of isolation:     Is patient in custody?  no    Is patient in restraints? no    Vitals:    12/07/17 1125 12/07/17 1128   BP: 159/95    Pulse: 90    Resp: 17    Temp: 98 F (36.7 C)    SpO2: 98%    Weight:  92.4 kg (203 lb 12.8 oz)       Blood Cx Set #: 2 (12/07/17 1215 : Oliver HumFader, Casaundra Takacs, RN)  Tubes Collected: Blue, Yellow SST, Green PST, Green PST on Ice (12/07/17 1216 : Oliver HumFader, Leshawn Houseworth, RN)  Initial Lactate (time acquired) : 1150 (12/07/17 1216 : Oliver HumFader, Omid Deardorff, RN)    No results found for: WBC, RBC, HGB, HCT, MCV, MCHC, RDW, PLT, MPV    No results found for: NA, K, CL, BICARB, BUN, CREAT, GLU, [place]    No results found for: BNP, PHOS, MG, LACTATE, AMMONIA, IONCA, ARTIONCA    No results found for: CPK, CKMBH, TROPONIN    No results found for: PH, PCO2, O2CONTENT, IVHC3, IVBE, O2SAT, UNPH, UNPCO2, ARTPH, ARTPCO2, ARTO2CNT, IAHC3, IABE, ARTO2SAT, UNAPH, UNAPCO2    No results found for this visit on 12/07/17.      Patient Lines/Drains/Airways Status    Active PICC Line / CVC Line / PIV Line / Drain / Airway / Intraosseous Line / Epidural Line / ART Line / Line Type / Wound     Name: Placement date: Placement time: Site: Days:    Peripheral IV - 22 G Left Hand  12/07/17   1207   Hand  less than 1    Traumatic  Injury  Abrasion Full thickness Knee Left;Medial  12/07/17   1157   Knee  less than 1                    ED Handoff Report is ready for review.  Admitting RN may reach  Emergency Department RN, Oliver HumSara Adem Costlow, RN, at [phone] with any questions.

## 2017-12-07 NOTE — ED Notes (Signed)
To CT via gurney

## 2017-12-07 NOTE — ED EKG Interpretation (Signed)
ED EKG Interpretation    EKG: Normal Sinus Rhythm with Normal Axis and nonspecific ST and T wave changes, artifact in baseline from tremor, rate 87, QTc 469.

## 2017-12-07 NOTE — ED Notes (Signed)
Bed: 23  Expected date:   Expected time:   Means of arrival:   Comments:  FTT medic

## 2017-12-07 NOTE — ED Notes (Signed)
To X-ray

## 2017-12-07 NOTE — ED Notes (Signed)
12/07/2017 11:33 AM Brendan Huerta    An EKG was handed to Dr. Redmond BasemanHayden

## 2017-12-07 NOTE — ED MD Progress Note (Signed)
Sign out from Dr. Redmond BasemanHayden at [facility]5PM  77M hx of CVA (residual left-sided weakness), DM, HTN, presents after being found down - possibly 7 days - in home.   Noted to have AKI with rhabdo. IVF in progress.  Admitted to Medicine for further care.   Will CTM while awaiting transfer to inpt bed.   Patient endorses understanding of the care plan.

## 2017-12-07 NOTE — ED Provider Notes (Signed)
Emergency Department Note  [facility] electronic medical record reviewed for pertinent medical history.     Nursing Triage Note:   Chief Complaint   Patient presents with   . Falls     per pt was down for approx 7-8x days s/p fall, per pt states, "my R side is incapacitated from a stroke." neighbor noticed his mail was building up and went to check on him, ox3, verbalized was able to reach in his fridge once and eat a candy bar. VSS, per pt has had 2x strokes, 1 in 08/2017 and does not recall the one prior       HPI:   72 year old male with a PMH significant for multiple strokes with residual right-sided deficits, hypertension, diabetes who presents with falls.  Patient was found down in his own home by EMS.  Patient states that he got dizzy well in the kitchen and then lost his balance after he is tripping over his "bad leg" which is his right leg with had which has weakness.  He states when he fell he was unable to get up any hit the back of his head on the floor.  He states he has been on the floor for 7 days and was able to crawl to the for treat her wants to get a candy bar.  Patient is neighbors noticed that his male was building up and went to check on him and he was laying in the floor in his own urine and feces.  EMS on arrival noted that he was incontinent but was alert and oriented but looks very dehydrated on exam.  Patient complaining of head pain knee pain lower back pain.    HPI    Past Medical History:   Diagnosis Date   . HIV disease (CMS-HCC)        No past surgical history on file.    Family History:    No family history on file.    Social History:    Social History     Tobacco Use   . Smoking status: Not on file   Substance Use Topics   . Alcohol use: Not on file   . Drug use: Not on file       Medications:   None       Allergies: Patient has no known allergies.    Review of Systems:   Review of Systems  All other systems reviewed and negative unless otherwise noted in the HPI or above. This was done  per my custom and practice for systems appropriate to the chief complaint in an emergency department setting and varies depending on the quality of history that the patient is able to provide.      Physical Exam:   12/07/17  1125 12/07/17  1300 12/07/17  1400 12/07/17  1500   BP: 159/95 144/76 144/79 177/81   Pulse: 90 98  104   Resp: 17 22 16 22    Temp: 98 F (36.7 C)      SpO2: 98% 94% 95% 90%     Nursing note and vitals reviewed.     Physical Exam   Constitutional: He is oriented to person, place, and time. No distress.   Alert and cooperative speaking in full sentences   HENT:   Head: Normocephalic.   Right Ear: External ear normal.   Left Ear: External ear normal.   Nose: Nose normal.   Tenderness in small hematoma over back of head    Dry  mucous membranes   Eyes: Pupils are equal, round, and reactive to light. Conjunctivae and EOM are normal. No scleral icterus.   Neck: Trachea normal, normal range of motion, full passive range of motion without pain and phonation normal. Neck supple. No spinous process tenderness and no muscular tenderness present. No neck rigidity. No tracheal deviation and normal range of motion present. No thyroid mass present.   Cardiovascular: Normal rate, regular rhythm, normal heart sounds and intact distal pulses.   No murmur heard.  Pulmonary/Chest: Effort normal. No respiratory distress. He has no wheezes.   Abdominal: Soft. He exhibits no distension. There is no tenderness.   Musculoskeletal: Normal range of motion. He exhibits no edema or deformity.   Abrasion over left knee with scab and bleeding well controlled    Tenderness and decreased range of motion over thoracic spine and lumbar spine   Lymphadenopathy:     He has no cervical adenopathy.   Neurological: He is alert and oriented to person, place, and time. No cranial nerve deficit. Coordination normal.   Right-sided deficits at baseline per patient   Skin: He is not diaphoretic.   Pressure ulcers on sacrum and right hip      Psychiatric: He has a normal mood and affect. His behavior is normal.       Workup Review:  Workup Review as of Dec 07 1705   Others' Documentation   Sun Dec 07, 2017   1308 CPK 8700    [JS]   1250 Hgb 19    [JS]      Workup Review User Index  [JS] Conchita ParisStorey, John Raymond Avant, MD     Results for orders placed or performed during the hospital encounter of 12/07/17   CBC w/ Diff [place]   Result Value Ref Range    WBC 7.7 4.0 - 10.0 1000/mm3    RBC 5.86 4.60 - 6.10 mill/mm3    Hgb 19.6 (HH) 13.7 - 17.5 gm/dL    Hct 96.057.3 (H) 45.440.0 - 50.0 %    MCV 97.8 (H) 79.0 - 95.0 um3    MCH 33.4 (H) 26.0 - 32.0 pgm    MCHC 34.2 32.0 - 36.0 g/dL    RDW 09.812.7 11.912.0 - 14.714.0 %    MPV 11.3 9.4 - 12.4 fL    Plt Count 107 (L) 140 - 370 1000/mm3    Segs 77 %    Lymphocytes 15 %    Monocytes 7 %    Eosinophils 1 %    Basophils 0 %    ANC-Automated 5.9 1.6 - 7.0 1000/mm3    Abs Lymphs 1.1 0.8 - 3.1 1000/mm3    Abs Monos 0.6 0.2 - 0.8 1000/mm3    Abs Eosinophils 0.1 <0.1 - 0.5 1000/mm3    Abs Basophils 0.0 <0.1 1000/mm3    Diff Type Automated    Comprehensive Metabolic Panel Green   Result Value Ref Range    Glucose 196 (H) 70 - 99 mg/dL    BUN 62 (H) 8 - 23 mg/dL    Creatinine 8.291.53 (H) 0.67 - 1.17 mg/dL    GFR 54 mL/min    Sodium 145 136 - 145 mmol/L    Potassium 4.8 3.5 - 5.1 mmol/L    Chloride 100 98 - 107 mmol/L    Bicarbonate 30 (H) 22 - 29 mmol/L    Anion Gap 15 7 - 15 mmol/L    Calcium 10.2 8.5 - 10.6 mg/dL    Total Protein 56.210.0 (H)  6.0 - 8.0 g/dL    Albumin 4.2 3.5 - 5.2 g/dL    Bilirubin, Tot 1.612.26 (H) <1.2 mg/dL    AST (SGOT) 096214 (H) 0 - 40 U/L    ALT (SGPT) 90 (H) 0 - 41 U/L    Alkaline Phos 106 40 - 129 U/L   Magnesium, Blood Green Plasma Separator Tube   Result Value Ref Range    Magnesium 3.3 (H) 1.6 - 2.4 mg/dL   Phosphorus, Blood Green Plasma Separator Tube   Result Value Ref Range    Phosphorous 4.7 (H) 2.7 - 4.5 mg/dL   Prothrombin Time, Blood Blue   Result Value Ref Range    PT,Patient 12.4 9.7 - 12.5 sec    INR 1.1    aPTT,  Blood Blue   Result Value Ref Range    PTT 28 25 - 34 sec   Urinalysis with Culture Reflex, when indicated   Result Value Ref Range    Type Clean catch     Color Yellow Yellow    Appearance Clear Clear    Specific Gravity 1.023 1.002 - 1.030    pH 6.0 5.0 - 8.0    Protein Negative Negative    Glucose Negative Negative    Ketones Trace (A) Negative    Bilirubin Negative Negative    Blood 2+ (A) Negative    Urobilinogen 2+ (A) Negative    Nitrite Negative Negative    Leuk Esterase Negative Negative    WBC 0-2 0-2/HPF    RBC 3-5 (A) 0-2/HPF    Bacteria Rare None-Rare/HPF    Squam. Epithelial Cell 0-5(RARE) <6-10(FEW)    Mucus Rare None-Rare/HPF   CPK, Blood Green Plasma Separator Tube   Result Value Ref Range    CPK 8,679 (HH) 0 - 175 U/L   Pro Bnp, Blood Green Plasma Separator Tube   Result Value Ref Range    BNPP 69 0 - 899 pg/mL   Troponin T Gen 5 w/Reflex to CK/CKMB Green Plasma Separator Tube   Result Value Ref Range    Troponin T Gen 5 w/Reflex CK/CKMB 23 (H) <22 ng/L   Lactate, Blood - See Instructions   Result Value Ref Range    Lactate 2.2 (H) 0.5 - 2.0 mmol/L   Troponin T Gen 5 w/Reflex to CK/CKMB   Result Value Ref Range    Troponin T Gen 5 w/Reflex CK/CKMB 28 (H) <22 ng/L   CKMB + Index (No CPK), Blood   Result Value Ref Range    CK-MB 30.0 (H) 0.0 - 4.8 ng/mL    CK-MB Index 0.3 %   Lactate, Blood - See Instructions   Result Value Ref Range    Lactate 3.1 (H) 0.5 - 2.0 mmol/L   CKMB + Index (No CPK), Blood   Result Value Ref Range    CK-MB 16.0 (H) 0.0 - 4.8 ng/mL    CK-MB Index 0.2 %   CPK, Blood   Result Value Ref Range    CPK 7,749 (HH) 0 - 175 U/L   GLUCOSE (POCT)   Result Value Ref Range    Glucose (POCT) 202 (H) 70 - 99 mg/dL   HCV Antibody with Reflex Quant 2 Serum Separator Tubes   Result Value Ref Range    Hepatitis C Ab Non Reactive    Blood Culture Routine Blood Culture Set   Result Value Ref Range    Blood Culture Result Culture in progress.      X-Ray Thoracic Spine 2  Views   Final Result      IMPRESSION:   No acute bone or joint injury seen about the left knee, thoracic spine or lumbar spine/pelvis.      Degenerative changes as described above.      X-Ray Lumbosacral Spine 2 Or 3 Views   Final Result   IMPRESSION:   No acute bone or joint injury seen about the left knee, thoracic spine or lumbar spine/pelvis.      Degenerative changes as described above.      X-Ray Knee 1 Or 2 Views - Left   Final Result   IMPRESSION:   No acute bone or joint injury seen about the left knee, thoracic spine or lumbar spine/pelvis.      Degenerative changes as described above.      CT Head W/O Contrast   Preliminary Result   IMPRESSION:   No acute intracranial hemorrhage, midline shift, hydrocephalus or displaced skull fracture.      Regions of encephalomalacia/gliosis as described above. Mild hypertensive white matter change.         X-Ray Chest Single View   Final Result   IMPRESSION:   No radiographic evidence of acute injury to the chest.               Impression & Initial ED Plan:  72 year old  male presents with falls.  Patient was found down at home for unknown amount of time.  Planned order basic labs CPK troponin BNP, ECG, chest x-ray, CT head, x-ray left knee, x-ray lumbar spine x-ray thoracic spine.     Lab showing elevated creatinine at 1.5 as well as elevated CPK at over 8000.  Elevated hemoglobin 19.6 as well as elevated magnesium and phosphorus.  Labs consistent with severe dehydration.  Plan to give IV fluids and reassess.  Chest x-ray with no evidence of pulmonary edema or acute cardiopulmonary process.  CT head negative for bleed or fracture.  X-rays of spine and knee no acute fracture dislocation.  Patient is a with generalized weakness and unable to ambulate.  Lactate initially 2.9 and after 2 L of LR presently elevated at 3.1 plan to admit to Medicine for further rehydration and monitoring as well as probable SNF.  Patient vital signs stable throughout ED stay with no evidence of sepsis or shock.   Patient signed out awaiting inpatient bed placement.     I have discussed my evaluation and care plan for the patient with the attending physician Dr. Redmond Baseman.     Rosalio Loud, MD  Resident  12/07/17 Ninfa Linden       Jaci Carrel, MD  12/08/17 [phone]

## 2017-12-08 LAB — CBC WITH DIFF, BLOOD
ANC-Manual Mode: 9.6 10*3/uL — ABNORMAL HIGH (ref 1.6–7.0)
Abs Basophils: 0 10*3/uL (ref <0.1)
Abs Eosinophils: 0 10*3/uL (ref 0.1–0.5)
Abs Lymphs: 0.7 10*3/uL — ABNORMAL LOW (ref 0.8–3.1)
Abs Monos: 0.7 10*3/uL (ref 0.2–0.8)
Basophils: 0 %
Eosinophils: 0 %
Hct: 46.8 % (ref 40.0–50.0)
Hgb: 15.6 gm/dL (ref 13.7–17.5)
Lymphocytes: 6 %
MCH: 32.6 pg — ABNORMAL HIGH (ref 26.0–32.0)
MCHC: 33.3 g/dL (ref 32.0–36.0)
MCV: 97.9 um3 — ABNORMAL HIGH (ref 79.0–95.0)
MPV: 11 fL (ref 9.4–12.4)
Monocytes: 6 %
Plt Count: 85 10*3/uL — ABNORMAL LOW (ref 140–370)
RBC: 4.78 10*6/uL (ref 4.60–6.10)
RDW: 12.8 % (ref 12.0–14.0)
Segs: 86 %
WBC: 10.9 10*3/uL — ABNORMAL HIGH (ref 4.0–10.0)

## 2017-12-08 LAB — GLUCOSE (POCT)
Glucose (POCT): 189 mg/dL — ABNORMAL HIGH (ref 70–99)
Glucose (POCT): 229 mg/dL — ABNORMAL HIGH (ref 70–99)
Glucose (POCT): 226 mg/dL — ABNORMAL HIGH (ref 70–99)
Glucose (POCT): 248 mg/dL — ABNORMAL HIGH (ref 70–99)

## 2017-12-08 LAB — MDIFF
Bands: 2 % (ref 0–15)
Number of Cells Counted: 115
Plt Est: DECREASED
RBC Comment: NORMAL

## 2017-12-08 LAB — COMPREHENSIVE METABOLIC PANEL, BLOOD
ALT (SGPT): 58 U/L — ABNORMAL HIGH (ref 0–41)
AST (SGOT): 138 U/L — ABNORMAL HIGH (ref 0–40)
Albumin: 2.9 g/dL — ABNORMAL LOW (ref 3.5–5.2)
Alkaline Phos: 79 U/L (ref 40–129)
Anion Gap: 12 mmol/L (ref 7–15)
BUN: 41 mg/dL — ABNORMAL HIGH (ref 8–23)
Bicarbonate: 27 mmol/L (ref 22–29)
Bilirubin, Tot: 1.88 mg/dL — ABNORMAL HIGH (ref <1.2)
Calcium: 8.8 mg/dL (ref 8.5–10.6)
Chloride: 106 mmol/L (ref 98–107)
Creatinine: 1.17 mg/dL (ref 0.67–1.17)
GFR: >60 mL/min
Glucose: 211 mg/dL — ABNORMAL HIGH (ref 70–99)
Potassium: 4.2 mmol/L (ref 3.5–5.1)
Sodium: 145 mmol/L (ref 136–145)
Total Protein: 7.1 g/dL (ref 6.0–8.0)

## 2017-12-08 LAB — CPK-CREATINE PHOSPHOKINASE, BLOOD: CPK: 6899 U/L (ref 0–175)

## 2017-12-08 LAB — LACTATE, BLOOD: Lactate: 1.8 mmol/L (ref 0.5–2.0)

## 2017-12-08 LAB — PHOSPHORUS, BLOOD: Phosphorous: 2.8 mg/dL (ref 2.7–4.5)

## 2017-12-08 LAB — MAGNESIUM, BLOOD: Magnesium: 2.5 mg/dL — ABNORMAL HIGH (ref 1.6–2.4)

## 2017-12-08 LAB — BILIRUBIN, DIR BLOOD: Bilirubin, Dir: 0.5 mg/dL — ABNORMAL HIGH (ref <0.2)

## 2017-12-08 MED ORDER — INSULIN GLARGINE 100 UNIT/ML SC SOLN
50.00 [IU] | Freq: Two times a day (BID) | SUBCUTANEOUS | Status: DC
Start: ? — End: 2017-12-09

## 2017-12-08 MED ORDER — METFORMIN HCL 500 MG OR TABS
500.00 mg | ORAL_TABLET | Freq: Two times a day (BID) | ORAL | Status: DC
Start: ? — End: 2017-12-09

## 2017-12-08 MED ORDER — CETIRIZINE HCL 10 MG OR TABS
10.00 mg | ORAL_TABLET | Freq: Every day | ORAL | Status: DC
Start: ? — End: 2017-12-09

## 2017-12-08 MED ORDER — DORAVIRINE 100 MG PO TABS
100.0000 mg | ORAL_TABLET | Freq: Every day | ORAL | Status: DC
Start: 2017-12-08 — End: 2017-12-09
  Administered 2017-12-08: 100 mg via ORAL
  Filled 2017-12-08 (×2): qty 1

## 2017-12-08 MED ORDER — ROSUVASTATIN CALCIUM 20 MG OR TABS: 10.00 mg | ORAL_TABLET | Freq: Every day | ORAL | Status: AC

## 2017-12-08 MED ORDER — FINASTERIDE 5 MG OR TABS
5.0000 mg | ORAL_TABLET | Freq: Every day | ORAL | Status: DC
Start: 2017-12-09 — End: 2017-12-09
  Administered 2017-12-09: 5 mg via ORAL
  Filled 2017-12-08: qty 1

## 2017-12-08 MED ORDER — DARUNAVIR-COBICISTAT 800-150 MG PO TABS
1.0000 | ORAL_TABLET | Freq: Every day | ORAL | Status: DC
Start: 2017-12-08 — End: 2017-12-09
  Administered 2017-12-08: 1 via ORAL
  Filled 2017-12-08 (×2): qty 1

## 2017-12-08 MED ORDER — SODIUM CHLORIDE 0.9 % IV SOLN
INTRAVENOUS | Status: AC
Start: 2017-12-08 — End: 2017-12-09
  Administered 2017-12-08: 18:00:00 via INTRAVENOUS

## 2017-12-08 MED ORDER — ASPIRIN 81 MG OR TABS: 81.00 mg | ORAL_TABLET | Freq: Every day | ORAL | Status: AC

## 2017-12-08 MED ORDER — DARUNAVIR-COBICISTAT 800-150 MG PO TABS: 1.00 | ORAL_TABLET | Freq: Every day | ORAL | Status: AC

## 2017-12-08 MED ORDER — ASPIRIN 81 MG OR CHEW
81.0000 mg | CHEWABLE_TABLET | Freq: Every day | ORAL | Status: DC
Start: 2017-12-08 — End: 2017-12-09
  Administered 2017-12-08: 81 mg via ORAL
  Filled 2017-12-08: qty 1

## 2017-12-08 MED ORDER — DOLUTEGRAVIR SODIUM 50 MG PO TABS: 50.00 mg | ORAL_TABLET | ORAL | Status: AC

## 2017-12-08 MED ORDER — DOLUTEGRAVIR SODIUM 50 MG PO TABS
50.0000 mg | ORAL_TABLET | Freq: Every day | ORAL | Status: DC
Start: 2017-12-08 — End: 2017-12-09
  Administered 2017-12-08: 50 mg via ORAL
  Filled 2017-12-08: qty 1

## 2017-12-08 MED ORDER — SODIUM CHLORIDE 0.9 % IV SOLN
1000.0000 mg | INTRAVENOUS | Status: DC
Start: 2017-12-08 — End: 2017-12-09
  Administered 2017-12-08 – 2017-12-09 (×2): 1000 mg via INTRAVENOUS
  Filled 2017-12-08 (×2): qty 1000

## 2017-12-08 MED ORDER — HYDROCHLOROTHIAZIDE 12.5 MG OR TABS: 12.50 mg | ORAL_TABLET | Freq: Every day | ORAL | Status: AC

## 2017-12-08 MED ORDER — DORAVIRINE 100 MG PO TABS: 100.00 mg | ORAL_TABLET | Freq: Every day | ORAL | Status: AC

## 2017-12-08 MED ORDER — DULAGLUTIDE 1.5 MG/0.5ML SC SOPN
1.50 mg | PEN_INJECTOR | SUBCUTANEOUS | Status: DC
Start: ? — End: 2017-12-09

## 2017-12-08 MED ORDER — GLIPIZIDE 5 MG OR TABS
5.00 mg | ORAL_TABLET | Freq: Two times a day (BID) | ORAL | Status: DC
Start: ? — End: 2017-12-09

## 2017-12-08 MED ORDER — FINASTERIDE 5 MG OR TABS: 5.00 mg | ORAL_TABLET | Freq: Every day | ORAL | Status: AC

## 2017-12-08 MED ORDER — TAMSULOSIN HCL 0.4 MG PO CAPS: 0.80 mg | ORAL_CAPSULE | Freq: Every evening | ORAL | Status: AC

## 2017-12-08 NOTE — Plan of Care (Signed)
Problem: Promotion of Health and Safety  Goal: Promotion of Health and Safety  Description  The patient remains safe, receives appropriate treatment and achieves optimal outcomes (physically, psychosocially, and spiritually) within the limitations of the disease process by discharge.    Information below is the current care plan.  Outcome: Progressing  Flowsheets (Taken 12/08/2017 0202)  Guidelines: Inpatient Nursing Guidelines  Individualized Interventions/Recommendations #1: Provide hygine and skin care -patient has multiple skinbreak down due to fall and  has been on the floor for 7 days  Individualized Interventions/Recommendations #2 (if applicable): Maintaind fall precautions  Individualized Interventions/Recommendations #3 (if applicable): Aspiration precautions maintained  Individualized Interventions/Recommendations #4 (if applicable): Provide cluster care and instructed the patient to call for assistance  Outcome Evaluation (rationale for progressing/not progressing) every shift: patient is maiodorus and offered the patient bed bath and  patient refused , CHG Bath given , Photos taken from skin break down and pressure ulcer to the buttocks and sacrum .

## 2017-12-08 NOTE — Consults (Signed)
Wound/Ostomy RN Consult    Admit date: 12/07/2017    Brendan Huerta is a 72 year old male  Unit: 605/605    Patient has no known allergies.  Reason for consult:  Multiple deep tissue injuries related to being found down   Present on Admission?   yes  Admit diagnosis: Dehydration [E86.0]  Non-traumatic rhabdomyolysis [M62.82]  Rhabdomyolysis [M56.57]    72 year old male who is here for evaluation of the above chief complaint.  About one week ago, the patient "tripped over myself", hit his head and fell to the floor in his kitchen. After this fall, he was too weak to get up and ended up trying to crawl to the door. He was able to crack open the fridge and get a little food, but not much. His neighbors noticed his mail stacking up and called emergency responders.His is unable to provide many more details about the incident. He gets dizzy ~ 1x/month.     Patient presents with multiple evolving deep tissue injuries. Has a condom catheter in place which fell off and was replaced during the visit.       Medical Hx   Past Medical History:   Diagnosis Date   . CVA (cerebral vascular accident) (CMS-HCC) 07/2017    Left sided stroke, right motor dysfunction    . CVA (cerebral vascular accident) (CMS-HCC)     3 total   . DM2 (diabetes mellitus, type 2) (CMS-HCC)    . HIV disease (CMS-HCC)    . HTN (hypertension)      Surgery: Paste in Brief Op Note    CBC  Recent Labs     12/07/17  1206 12/08/17  0731   WBC 7.7 10.9*   RBC 5.86 4.78   HGB 19.6* 15.6   HCT 57.3* 46.8   MCV 97.8* 97.9*   MCH 33.4* 32.6*   MCHC 34.2 33.3   RDW 12.7 12.8   MPV 11.3 11.0   PLT 107* 85*   SEG 77 86   LYMPHS 15 6   MONOS 7 6   EOS 1 0   BASOS 0 0       Chemistry  Recent Labs     12/07/17  1206 12/08/17  0731   NA 145 145   K 4.8 4.2   CL 100 106   BICARB 30* 27   BUN 62* 41*   CREAT 1.53* 1.17   GLU 196* 211*   [place] 10.2 8.8   MG 3.3* 2.5*   PHOS 4.7* 2.8       LFTs  Recent Labs     12/07/17  1206 12/08/17  0731   ALK 106 79   AST 214* 138*      ALT 90* 58*   TBILI 2.26* 1.88*   DBILI  --  0.5*   ALB 4.2 2.9*       Coags  Recent Labs     12/07/17  1206   PT 12.4   PTT 28   INR 1.1       No results for input(s): PREALB in the last 2160 hours.    No results for input(s): A1C in the last 2160 hours.    Blood Culture (no units)   Date Value   12/07/2017     PROTEUS SPECIES DNA DETECTED   Identifiable resistance marker DNA NOT DETECTED                                                        .  This test detects DNA directly from blood cultures  for Escherichia coli, Pseudomonas aeruginosa,   Klebsiella pneumoniae, Klebsiella oxytoca,  Proteus sp., Enterobacter sp., Acinetobacter sp.,  and Citrobacter sp. The test also detects  carbapenemases (KPC, NDM, VIM, IMP, and OXA)  and extended spectrum beta lactamases (CTX-M).  The detection of DNA does not exclude the  possibility that organisms or resistance markers  not tested for by this assay are present.  The  detection of resistance markers may not always  correspond to phenotypic antibiotic resistance.  Results will be verified by culture/susceptibility  tests to follow.         Surgical History No past surgical history on file.    Current Nutrition: Diet Type: Carb Limited    Diet Status: PO    Pain: yes, to sacral area and right knee    Braden Score: 13    Complications this Hospitalization: rhabdo, AKI    Bed Surface: Isoflex standard  Last Physical Therapy date:    Last Occupational Therapy date:       Image:     Right sacral/buttocks area   Right hip          Left mid back     Right mid back        Left knee    Right sacral/buttocks area w/ evolving deep tissue injury that has undergone epidermal sloughing. The wound bed is purple and maroon, measures appx 4 x 4 cm with some adherent epidermal tissue that continues to peel. The wound is boggy. Left buttocks also with an evolving deep tissue injury that is intact at this time but starting to appear discolored and blistered.     The back has multiple areas  that are additionally evolving deep tissue injuries including the left mid back, right mid back and upper back that currently present as sloughing epidermal tissue. Unable to get precise measurement due to evolution.     Right hip w/ fluid filled blister that is an evolving deep tissue injury. Blister expressed purulent drainage and will likely continue to evolve. Measures appx 5 x 4 cm.     Left knee w/ abrasion vs deep tissue injury that is scattered with yellow dry drainage and dry blood. Removal of dressing was painful here more than any of the other areas.     Right heel (not photographed) also suspicious for evolving deep tissue injury. Difficult to discern due to dark pigmentation however, does appear to be purple though intact. Per staff, patient is c/o pain here.     Bilateral knees are calloused but intact over base of knee caps.     Barriers to healing: incontinence, immobility (CVA), dehydration, rhabdomyolysis, positive blood cultures, DM2, HIV.     Adjunctive Therapies: condom catheter    Current Goal of Treatment: wound healing, monitor for evolution of deep tissue injuries    Wound Care Recommendations: Evolving deep tissue injuries to Right buttocks, lower mid and upper back, right hip: Clean w/ wound cleanser and pat dry. Apply Xeroform (antimicrobial petroleum gauze) then mepilex. Change daily.     Right knee abrasion: Clean w/ wound cleanser and pat dry. Apply wound gel to open areas, then mepilex. Change daily.     Place on low air mattress replacement. Offload heels. Left to right turns, seat cushion if up in chair.     Patient/Family Education: none present, unclear how much patient was understanding.     Findings and recommendations communicated to: Seen w/ RN Gay Filler,  1st on call at the bedside.     Plan: f/u as needed while hospitalized    Wound Care nurse: Signed: Alvino Blood Saryna Kneeland RN, Vickey Huger

## 2017-12-08 NOTE — Plan of Care (Signed)
Problem: Promotion of Health and Safety  Goal: Promotion of Health and Safety  Description  The patient remains safe, receives appropriate treatment and achieves optimal outcomes (physically, psychosocially, and spiritually) within the limitations of the disease process by discharge.    Information below is the current care plan.  Flowsheets  Taken 12/08/2017 0810 by Rosiland OzWalker, Maddalyn Lutze Ann, RN  Patient /Family stated Goal: be taken care of   Taken 12/08/2017 0202 by Sandy Salaamhomas, Sonia, RN  Guidelines: Inpatient Nursing Guidelines  Individualized Interventions/Recommendations #2 (if applicable): Maintaind fall precautions  Individualized Interventions/Recommendations #4 (if applicable): Provide cluster care and instructed the patient to call for assistance  Taken 12/08/2017 1729 by Rosiland OzWalker, Safiyah Cisney Ann, RN  Individualized Interventions/Recommendations #1: Provide hygiene and skin care.  Individualized Interventions/Recommendations #3 (if applicable): Affixed Condom cath to path to help prevent moisture.  Individualized Interventions/Recommendations #5 (if applicable): Provided assistance with wound care to wound nurse.  Outcome Evaluation (rationale for progressing/not progressing) every shift: Patient remains stable.  Wound nurse at bedside this day to evaluate and provide wound care.  Patient received full bed bath.  Condom cath is patent and affixed properly.  Patient repositioned in bed frequently to prevent further skin breakdown.  Physician at bedside.  This RN requested Low Airloss Mattress while at bedside.  Order is pending.  Patient assisted with positioning changes as tolerated.

## 2017-12-08 NOTE — Progress Notes (Signed)
[facility] day:   1 day - Admitted on: 12/07/2017    Mr. Reinard is a 72 year old male with a PMH significant for DM2, HTN, CVAx3 (07/2017 most recent), and HIV (CD4 276 06/2017) who presented with likely rhabdomyolysis and AKI d/t severe volume depletion and right sided buttocks and hip pressure ulcers s/p ground level fall and prolonged down without food or drink for up to a week, likely 2-3 days. Found to have blood cultures positive for proteus species.    24 Hour - Interval Events   Bcx grew gram negative rods, patient refused repeat blood culture in early AM, started on Ceftriaxone, patient agreed to repeat blood culture in AM.    CTX 1083m daily started     Subjective    Poor subjective history this morning due to somnolence and decreased levels of alertness. Replied yes to feeling better, and no to symptoms.    ROS: Responded no to headache, fever, chills, chest pain, shortness of breath, and cough.       Physical Exam   Temp  Min: 97.3 F (36.3 C)  Max: 98.8 F (37.1 C)  Pulse  Min: 90  Max: 108  BP  Min: 102/64  Max: 177/81  Resp  Min: 14  Max: 22  SpO2  Min: 90 %  Max: 1540%    Diastolic down to 52 this morning, systolic 1981   BP 1191/47(BP Location: Left arm, BP Patient Position: Semi-Fowlers)   Pulse 108   Temp 98.8 F (37.1 C)   Resp 16   Ht 6' 1"  (1.854 m)   Wt 92.4 kg (203 lb 12.8 oz)   SpO2 98%   BMI 26.89 kg/m  O2 Device: None (Room air)      08/18 0600 - 08/19 0559  In: 2103 [P.O.:100; I.V.:2003]  Out: -   Urine x 0 Stool x 0 Emesis x 0      Gen: Somnolent, difficult to arouse for exam, NAD  HEENT: Hazy Cataracts, injected sclera, PERRL 3->2  Neck: Supple, no LAD, JVP flat  CV:  Tachycardic and regular rhythm, no murmurs, clicks, or gallops.  Resp: Patient could not sit up without pain, clear to auscultation of lateral and anterior lung fields, normal work of breathing, shallow breaths.  Abdomen: BS normal.  Abdomen soft, non-tender.  No masses or  organomegaly appreciated.  Extremities: Trace lower extremity edema, warm and well perfused, 2+ pulses  - Right buttocks sacral ulcer about 4cm and darker. Left buttocks has an evolving deep tissue injury, currently intact. dark meaty center. Scattered deep tissue injuries along the midline back.   Neuro: A&Ox2 (Responded yes to are you in hospital, did no know which hospital). Somnolent  RUE 4/5 strength with shoulder Abduction, flexion/extension at elbow.   LUE 4+/5 Strength with shoulder abduction, flexion/extension at elbow  Grip strength greater on left than right, unchanged from yesterday   LE neuro exam limited by pain  Crude sensory exam equivalent bilaterally.    Pertinent Labs and Imaging and Meds       WBC 10.9* (08/19) HGB 15.6 (08/19) PLT 85* (08/19)    HCT 46.8 (08/19)      %Neutrophils 86 (08/19) %Bands 2 (08/19) %Lymphs 6 (08/19) %Monos 6 (08/19) %Eos 0 (08/19) %Basos 0 (08/19)  Na 145 (08/19) CL 106 (08/19) BUN 41* (08/19) GLU   211* (08/19)   K 4.2 (08/19) CO2 27 (08/19) Cr 1.17 (08/19)  [place] 8.8 (08/19) Mg 2.5* (08/19) Phos 2.8 (08/19)  TP 7.1 (08/19) ALT 58* (08/19) TBILI 1.88* (08/19) ALK PHOS  79 (08/19)   ALB 2.9* (08/19) AST 138* (08/19) DBILI 0.5* (08/19)      Lactate 1.8 (08/19)  A1c    CD4   VIRAL LOAD    C3   C4   Anti-DSDNA        Recent Labs     12/07/17  1837 12/08/17  0759   GLUCPOCT 202* 189*     POC Glucose (mg/dL)  Avg: 195.5 mg/dL  Min: 189 mg/dL  Max: 202 mg/dL    Lactate 8/18:  2.2 -> 3.1 - >> 1.8 on 8/19    Troponin 8/18  28 -> 23    CPK 8/18:   8,679 -> 7,749    Blood Culture 8/18  Blood Culture Result Abnormal  Anaerobic Bottle Gram stain:   Gram negative rods   Called to and read back by:_Sonya rn   By PJA:[id number] 12/08/2017 03:02  CALM   Blood Culture Abnormal  PROTEUS SPECIES DNA DETECTED   Identifiable resistance marker DNA NOT DETECTED        Scheduled Medications  . cefTRIAXone (ROCEPHIN) IV  1,000 mg Q24H NR   . heparin  5,000 Units Q8H   . insulin lispro  1-10  Units TID AC   . nystatin  5 mL 4x Daily   . senna  2 tablet HS   . sodium chloride (PF)  3 mL Q8H     Continuous Medications  . sodium chloride     . sodium chloride       PRN Medications  . dextrose  12.5 g PRN   . glucagon  1 mg Once PRN   . glucose  4 tablet PRN   . glucose  1 Tube PRN   . nalOXone  0.1 mg Q2 Min PRN   . polyethylene glycol  17 g Q24H PRN   . sodium chloride (PF)  3 mL PRN   . sodium chloride   Continuous PRN   . sodium chloride   Continuous PRN   . traMADol  50 mg Q4H PRN       Assessment / Plan   Mr. Riebel is a 72 year old male with a PMH significant for DM2, HTN, CVAx3 (07/2017 most recent), and HIV (CD4 276 06/2017) who presented with likely rhabdomyolysis and AKI d/t severe volume depletion and right sided buttocks and hip pressure ulcers s/p ground level fall and prolonged down without food or drink for up to a week, likely 2-3 days. Found to have blood cultures positive for proteus species.    #Significant Volume Depletion:  #AKI vs CKD: Creatine down trend 1.53 -> 1.17, BUN remains elevated 41. Minimal urine output.  - 37m/hr NS infusion overnight (12hrs)  #Elevated Lactate (resolved): Now 1.8, was likely due to significant volume depletion and rhabdo, however blood culture positive for proteus, an unlikely contaminant. Source UTI or GI, reports blood in stool, likely gastroenteritis.   [ ]  f/u blood culture  #Rhabdomyolysis: Improving. CPK 8,679 -> 7,749 -> 6,899 (s/p fluids), AST 214 -> 138, Tbili 2.26 -> 1.88, + blood on U/A likely d/t myoglobinuria, RBC only 3-5.   #Elevated Hemoglobin: Hg 19.6 down trend to 15.6, likely hemoconcentration.  - Repeat CBC   - Daily CMP to trend creatinine, LFTs, lytes and BUN  - Daily CPK     #Proteus Bacteremia: Afebrile with unclear source. Was found  down in his own urine with multiple open wounds. Stated he had blood covered stool in past month on ROS. No LE or nitrites on U/A. WBC uptrend from, 10.9 from 7.7.   - Continue Ceftriaxone  [ ]  F/u  Repeat blood culture  [ ]  F/u sensitivities     #Deep tissue pressure wound Right Buttock, right hip, and back: Developed after fall and lying immobile on right side, pictures in wound care  - Tramadol 34m PRN q4 for pain  - Wound care for dressing changes and management     #DM[facility]. Received 4 units of ISS as of lunch, [facility] 100[facility] Lantus qday. Patient has not eaten for multiple day or received insulin therapy.   - Insulin sliding scale  [ ]  Titrate Lantus up tomorrow    #HIV: CD4 of 276 06/2017, thrush on exam.  - Resume home regimen: Prezcobix, Tivicay, Pifeltro    #Thrush: On exam  - Nystatin QID    #Troponemia (Resolved): Down trended 28 -> 23, likely due to volume depletion. Patient denies cardiac symptoms, EKG showed nonspecific ST changes.     #Bowel Prep:  -Senna and miralax PRN    #History of CVA: Right hemiplegia: 4/5 strength in RUE and 2+/5 strength in RLE (limited by pain and extreme deconditioning) compared to 4+/5 strength in LUE and 3/5 strength in LLE. Mild spasticity CT head showed no acute findings   - Monitor for new onset focal neurologic findings    #FTT and Fall risk: Patient with HIV, unknown CD4 count, without food or drink for 1 week, extreme deconditioning and weakness 2/2 multiple CVAs.   - Carb limited Diet  - Boost    - Daily BMP to monitor lyte recover  [ ]  F/u social work and case management, will likely need placement  [ ]  F/u PT eval  [ ]  Consider Nutrition Consult    Chronic Conditions:  #BPH: Finasteride, hold Flomax d/t fluid status  #CVD: Resume home aspirin  #HTN: Hold HCTZ d/t low fluid status    Discharge planning: clinical improvement  Foley: External urinary catheter  VTE prophylaxis: SQ heparin  Diet: Diet Therapeutic; Carbohydrate Limited  Nutritional Supplement Boost Glucose Control; Strawberry (BCG); Deliver Supplements: TID  Last BM:    IV fluids: Normal Saline at 50 ml/hr  Access:  PIVs  Code status: Full Code    The history and exam was performed under the direct supervision of Dr. RQueen Blossom The case was discussed in detail and clinical decisions were made in conjuction with Dr. RQueen Blossom the attending of record.     AHarrison Mons MS4

## 2017-12-08 NOTE — Interdisciplinary (Signed)
12/08/17 1657   Initial Assessment   CM Initial Assessment Completed   Patient Information   Where was the patient admitted from? 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  Per pt lives alone in apt & uses [facility]. Pt denies prior [facility] or SNF.  Pt has [facility] coverage listed & PCP Dr. Mable Fill. Pt reports receiving Rx from [place].  CM will cont to follow for any DCP needs.

## 2017-12-09 LAB — CBC WITH DIFF, BLOOD
ANC-Automated: 5.7 10*3/uL (ref 1.6–7.0)
Abs Basophils: 0 10*3/uL (ref <0.1)
Abs Eosinophils: 0.2 10*3/uL (ref 0.1–0.5)
Abs Lymphs: 1.4 10*3/uL (ref 0.8–3.1)
Abs Monos: 0.9 10*3/uL — ABNORMAL HIGH (ref 0.2–0.8)
Basophils: 0 %
Eosinophils: 2 %
Hct: 42.4 % (ref 40.0–50.0)
Hgb: 14 gm/dL (ref 13.7–17.5)
Lymphocytes: 17 %
MCH: 31.5 pg (ref 26.0–32.0)
MCHC: 33 g/dL (ref 32.0–36.0)
MCV: 95.3 um3 — ABNORMAL HIGH (ref 79.0–95.0)
MPV: 11.7 fL (ref 9.4–12.4)
Monocytes: 11 %
Plt Count: 71 10*3/uL — ABNORMAL LOW (ref 140–370)
RBC: 4.45 10*6/uL — ABNORMAL LOW (ref 4.60–6.10)
RDW: 12.5 % (ref 12.0–14.0)
Segs: 70 %
WBC: 8.2 10*3/uL (ref 4.0–10.0)

## 2017-12-09 LAB — ECG 12-LEAD
ATRIAL RATE: 93 {beats}/min
ECG INTERPRETATION: NORMAL
P AXIS: 76 degrees
PR INTERVAL: 134 ms
QRS INTERVAL/DURATION: 68 ms
QT: 382 ms
QTC INTERVAL: 474 ms
R AXIS: -18 degrees
T AXIS: 45 degrees
VENTRICULAR RATE: 93 {beats}/min

## 2017-12-09 LAB — T CELL SUBSETS, BLOOD
CD4+ T-Cell %: 24 % — ABNORMAL LOW (ref 29–61)
CD4+ T-Cell Abs: 322 cells/uL (ref 250–1200)
CD4:CD8 Ratio: 0.51 — ABNORMAL LOW (ref 0.70–4.00)
CD8+ T-Cell %: 47 % — ABNORMAL HIGH (ref 11–38)
CD8+ T-Cell Abs: 630 cells/uL (ref 100–800)

## 2017-12-09 LAB — COMPREHENSIVE METABOLIC PANEL, BLOOD
ALT (SGPT): 53 U/L — ABNORMAL HIGH (ref 0–41)
AST (SGOT): 90 U/L — ABNORMAL HIGH (ref 0–40)
Albumin: 2.8 g/dL — ABNORMAL LOW (ref 3.5–5.2)
Alkaline Phos: 70 U/L (ref 40–129)
Anion Gap: 10 mmol/L (ref 7–15)
BUN: 27 mg/dL — ABNORMAL HIGH (ref 8–23)
Bicarbonate: 27 mmol/L (ref 22–29)
Bilirubin, Tot: 1.75 mg/dL — ABNORMAL HIGH (ref <1.2)
Calcium: 8.3 mg/dL — ABNORMAL LOW (ref 8.5–10.6)
Chloride: 106 mmol/L (ref 98–107)
Creatinine: 1.08 mg/dL (ref 0.67–1.17)
GFR: >60 mL/min
Glucose: 240 mg/dL — ABNORMAL HIGH (ref 70–99)
Potassium: 3.9 mmol/L (ref 3.5–5.1)
Sodium: 143 mmol/L (ref 136–145)
Total Protein: 6.4 g/dL (ref 6.0–8.0)

## 2017-12-09 LAB — GLUCOSE (POCT)
Glucose (POCT): 220 mg/dL — ABNORMAL HIGH (ref 70–99)
Glucose (POCT): 306 mg/dL — ABNORMAL HIGH (ref 70–99)

## 2017-12-09 LAB — MRSA SURVEILLANCE CULTURE

## 2017-12-09 LAB — CPK-CREATINE PHOSPHOKINASE, BLOOD: CPK: 3640 U/L — ABNORMAL HIGH (ref 0–175)

## 2017-12-09 LAB — HIV-1 RNA QUANT/PLASMA: HIV-1 RNA Ultra Quant, Plasma: 29 copies/mL — AB

## 2017-12-09 MED ORDER — PNEUMOCOCCAL VAC POLYVALENT 25 MCG/0.5ML IJ INJ (CUSTOM)
0.5000 mL | INJECTION | INTRAMUSCULAR | Status: AC
Start: 2017-12-09 — End: 2017-12-09
  Administered 2017-12-09: 0.5 mL via INTRAMUSCULAR
  Filled 2017-12-09: qty 0.5

## 2017-12-09 MED ORDER — INSULIN LISPRO (HUMAN) 100 UNIT/ML SC SOLN (CUSTOM)
6.0000 [IU] | Freq: Three times a day (TID) | INTRAMUSCULAR | Status: DC
Start: 2017-12-09 — End: 2017-12-09

## 2017-12-09 MED ORDER — POLYETHYLENE GLYCOL 3350 OR POWD
17.0000 g | Freq: Every day | ORAL | 0 refills | Status: AC
Start: 2017-12-09 — End: ?

## 2017-12-09 MED ORDER — SENNA 8.6 MG OR TABS
17.2000 mg | ORAL_TABLET | Freq: Every evening | ORAL | 0 refills | Status: AC
Start: 2017-12-09 — End: ?

## 2017-12-09 MED ORDER — INSULIN LISPRO (HUMAN) 100 UNIT/ML SC SOLN (CUSTOM)
1.0000 [IU] | Freq: Four times a day (QID) | INTRAMUSCULAR | 0 refills | Status: AC
Start: 2017-12-09 — End: ?

## 2017-12-09 MED ORDER — INSULIN GLARGINE 100 UNIT/ML SC SOLN
22.0000 [IU] | Freq: Every day | SUBCUTANEOUS | 0 refills | Status: AC
Start: 2017-12-09 — End: ?

## 2017-12-09 MED ORDER — SODIUM CHLORIDE 0.9 % IV SOLN
1000.0000 mg | INTRAVENOUS | 0 refills | Status: AC
Start: 2017-12-10 — End: ?

## 2017-12-09 MED ORDER — INSULIN LISPRO (HUMAN) 100 UNIT/ML SC SOLN (CUSTOM)
6.0000 [IU] | Freq: Three times a day (TID) | INTRAMUSCULAR | 0 refills | Status: AC
Start: 2017-12-09 — End: ?

## 2017-12-09 MED ORDER — INSULIN REGULAR HUMAN 100 UNIT/ML IJ SOLN
1.0000 [IU] | Freq: Four times a day (QID) | INTRAMUSCULAR | Status: DC
Start: 2017-12-09 — End: 2017-12-09

## 2017-12-09 MED ORDER — NYSTATIN 100000 UNIT/ML MT SUSP
5.0000 mL | Freq: Four times a day (QID) | OROMUCOSAL | 0 refills | Status: AC
Start: 2017-12-09 — End: 2017-12-12

## 2017-12-09 MED ORDER — INSULIN LISPRO (HUMAN) 100 UNIT/ML SC SOLN (CUSTOM)
1.0000 [IU] | Freq: Four times a day (QID) | INTRAMUSCULAR | Status: DC
Start: 2017-12-09 — End: 2017-12-09

## 2017-12-09 MED ORDER — INSULIN GLARGINE 100 UNIT/ML SC SOLN
22.0000 [IU] | Freq: Every day | SUBCUTANEOUS | Status: DC
Start: 2017-12-09 — End: 2017-12-09
  Administered 2017-12-09: 22 [IU] via SUBCUTANEOUS
  Filled 2017-12-09: qty 22

## 2017-12-09 MED ORDER — INSULIN GLARGINE 100 UNIT/ML SC SOLN
4.0000 [IU] | Freq: Every evening | SUBCUTANEOUS | Status: DC
Start: 2017-12-09 — End: 2017-12-09

## 2017-12-09 NOTE — Interdisciplinary (Signed)
PT Contact     Row Name 12/09/17 1100       Therapy Contact Note    Contact Time  1130    Therapy not provided at this time as  Patient/family/caregiver declined treatment.    Additional Comments  attempted PT evaluation. Pt initially agreeable, and subjective completed. However pt requested use of urinal prior to mobility. After using, pt reported feeling to tired/sleepy at this time to attempt mobility. Educated pt on benefits of PT however pt adamant to rest at this time. Will return at later time if able. RN notified.

## 2017-12-09 NOTE — Discharge Instructions (Signed)
Diagnosis and Reason for Admission    You were admitted to the hospital for the following reason(s):  Rhabdomyolysis, Proteus bacteremia    Your full diagnosis list is located on this After Visit Summary in the Hospital Problems section.    What Happened During Your Hospital Stay    The main tests and treatments done for you during this hospitalization were:    IV fluids, IV antibiotics    The following evaluation is still important to complete after [facility]:  - follow up blood cultures for proteus sensitivities  -follow up repeat blood cultures       Instructions for After Discharge    Your diet at home should be a diabetic (low-sugar) diet.    Your activity level at home should be:  as much exercise or activity as you can tolerate.    Specific activity restrictions:    None    Wound or tube care instructions:  Keep area clean and dry.    Your medication list is located on this After Visit Summary in the Current Discharge Medication List section.  Your nurse will review this information with you before you leave the hospital.    It is very important for you to keep a current medication list with you in order to assist your doctors with your medical care.  Bring this After Visit Summary with you to your follow up appointments.

## 2017-12-09 NOTE — Plan of Care (Signed)
Problem: Promotion of Health and Safety  Goal: Promotion of Health and Safety  Description  The patient remains safe, receives appropriate treatment and achieves optimal outcomes (physically, psychosocially, and spiritually) within the limitations of the disease process by discharge.    Information below is the current care plan.  Outcome: Progressing  Flowsheets  Taken 12/09/2017 0800  Patient /Family stated Goal: "I want my breakfast."  Taken 12/09/2017 1025  Individualized Interventions/Recommendations #1: Enc pt to participate w/ ADLS.  Individualized Interventions/Recommendations #2 (if applicable): Skin/ wound kept clean and dry. Drsgs were changes as ordered.  Pt on air mattress, assisted w/ repostioning.  Perineal hygiene provided and protective ointment applied prn.  Individualized Interventions/Recommendations #3 (if applicable): Reminded pt to call for help as needed.  Individualized Interventions/Recommendations #4 (if applicable): Informed pt re: BS results and necessary interventions.  Outcome Evaluation (rationale for progressing/not progressing) every shift: Pstill requiring motivation to participate w/ ADLS.  Pt has been cooperative w/ poc, agreed to help when help is needed. Informed pt of BS results and necessary interventions.  Goal ongoing.

## 2017-12-09 NOTE — Discharge Summary (Signed)
Date of Admission:  12/07/2017  Date of Discharge:  12/09/17    Patient Name:  Brendan Huerta    Principal Diagnosis (required):  Rhabdomyolysis      Hospital Problem List (required):  Active Hospital Problems    Diagnosis   . *Rhabdomyolysis [facility]      Resolved Hospital Problems    Diagnosis   . Dehydration [E86.0]       Additional Hospital Diagnoses ("rule out" or "suspected" diagnoses, etc.):     Dehydration  Decubitus ulcers  AKI  Proteus bacteremia    Principal Procedure During This Hospitalization (required):  CT imaging of head    Other Procedures Performed During This Hospitalization (required):  None    Procedure results are available in Chart Review in Epic.  For those providers external to [place], the key procedure results are listed below:    CT head:   IMPRESSION:  No acute intracranial hemorrhage, midline shift, hydrocephalus or displaced skull fracture.    Extensive old right worse than left bilateral cerebellar encephalomalacia/gliosis as described above. Sequela of prior/old left basal ganglia probable hypertensive hemorrhage. Mild cerebral subcortical and periventricular hypertensive white-matter change.    Blood Culture Result Abnormal  Anaerobic Bottle Gram stain:   Gram negative rods   Called to and read back by:_Sonya rn   By WUJ:[id number] 12/08/2017 03:02  CALM   Blood Culture Abnormal  PROTEUS SPECIES DNA DETECTED   Identifiable resistance marker DNA NOT DETECTED                              .   This test detects DNA directly from blood cultures   for Escherichia coli, Pseudomonas aeruginosa,   Klebsiella pneumoniae, Klebsiella oxytoca,   Proteus sp., Enterobacter sp., Acinetobacter sp.,   and Citrobacter sp. The test also detects   carbapenemases (KPC, NDM, VIM, IMP, and OXA)   and extended spectrum beta lactamases (CTX-M).   The detection of DNA does not exclude the   possibility that organisms or resistance markers   not tested for by this assay are  present. The   detection of resistance markers may not always   correspond to phenotypic antibiotic resistance.   Results will be verified by culture/susceptibility   tests to follow.  CALM   Blood Culture Abnormal  Proteus vulgaris   Anaerobic Bottle:   Identification performed by [employer]( Maldi-ToF). This test           Consultations Obtained During This Hospitalization:  Wound Care Service    Key consultant recommendations:    Wound Care:   Wound Care Recommendations: Evolving deep tissue injuries to Right buttocks, lower mid and upper back, right hip: Clean w/ wound cleanser and pat dry. Apply Xeroform (antimicrobial petroleum gauze) then mepilex. Change daily.     Right knee abrasion: Clean w/ wound cleanser and pat dry. Apply wound gel to open areas, then mepilex. Change daily.     Place on low air mattress replacement. Offload heels. Left to right turns, seat cushion if up in chair.    Reason for Admission to the Hospital / History of Present Illness:       History of Present Illness:     Brendan Huerta is a 72 year old male who is here for evaluation of the above chief complaint.  About one week ago, the patient "tripped over myself", hit his head and fell to the floor in his kitchen.  After this fall, he was too weak to get up and ended up trying to crawl to the door. He was able to crack open the fridge and get a little food, but not much. His neighbors noticed his mail stacking up and called emergency responders.His is unable to provide many more details about the incident. He gets dizzy ~ 1x/month.     He notes that his prior stroke left him with right-sided weakness and the he walks with a walker, but only when leaving his residence. He is unclear what medications he is taking. He also notes a headache.    Hospital Course by Problem (required):    #Rhabdomyolysis- Patient found down for about 7 days in his kitchen. Here he had CPK elevation to a max of 8k, which downtrended to 3K by  the time of transfer. Mild transaminases and bilirubin elevation that improved with fluids and time.     #AKI- Thought due to severe dehydration coupled with injury from myoglobin deposition. Creatinine downtrended from 1.53 to 1.08 at time of transfer. Unclear what patient's baseline is.     #Proteus Bacteremia- Patient's blood cultures on admission 8/18 were positive for Proteus and unspeciated Gram negative rods. Sensitivities were not back by the time patient was transferred. Repeat blood cultures on 12/08/17 were drawn but were negative x24 hours. He was continued on ceftriaxone until sensitivities resulted. Please follow up blood cultures for sensitivities and narrow antibiotics as appropriate.     #DM2- Patient's home medications were held and he was started on Insulin inpatient. Started on lantus 22U and Lispro 6 U TID with low intensity sliding scale. He may resume home diabetic medications when outside of acute illness window.     #Multiple wounds:   Patient found to have multiple wounds and ulcers. Wound care consulted, continue wound care.       Tests Outstanding at Discharge Requiring Follow Up:  Culture of blood on 8/18 and 8/19    Discharge Condition (required):  Stable.    Key Physical Exam Findings at Discharge:  Mental Status Exam: Patient is alert and oriented to person, place, time, and situation.  No significant physical examination findings at the time of discharge.    Discharge Diet:  Diabetic / low-carbohydrate.    Discharge Medications:     What To Do With Your Medications      START taking these medications      Add'l Info   cefTRIAXone (ROCEPHIN) 1,000 mg in sodium chloride 50 mL IVPB  Inject 1,000 mg into vein every 24 hours Indications: [place] BLOODSTREAM INFECTION.  Start taking on:  12/10/2017   Quantity:  10000 mg  Refills:  0     * insulin lispro 100 units/mL injection  Commonly known as:  HUMALOG or ADMELOG  Inject 1-10 Units under the skin 4 times daily (with meals and nightly).    Quantity:  10 mL  Refills:  0     * insulin lispro 100 units/mL injection  Commonly known as:  HUMALOG or ADMELOG  Inject 6 Units under the skin 3 times daily (with meals).   Quantity:  10 mL  Refills:  0     nystatin 100,000 units/mL suspension  Commonly known as:  MYCOSTATIN  Take 5 mL (500,000 Units) by mouth 4 times daily for 3 days.   Quantity:  280 mL  Refills:  0     polyethylene glycol powder  Commonly known as:  GLYCOLAX  Take 17 g by mouth daily.  Quantity:  255 g  Refills:  0     senna 8.6 MG tablet  Commonly known as:  SENOKOT  Take 2 tablets (17.2 mg) by mouth at bedtime.   Quantity:  30 tablet  Refills:  0         * This list has 2 medication(s) that are the same as other medications prescribed for you. Read the directions carefully, and ask your doctor or other care provider to review them with you.            CHANGE how you take these medications      Add'l Info   insulin glargine 100 UNIT/ML injection  Commonly known as:  LANTUS  Inject 22 Units under the skin daily (before lunch).   Quantity:  10 mL  Refills:  0  What changed:     how much to take   when to take this        CONTINUE taking these medications      Add'l Info   aspirin 81 MG tablet  Take 81 mg by mouth daily.   Refills:  0     darunavir-cobicistat 800-150 MG tablet  Commonly known as:  PREZCOBIX  Take 1 tablet by mouth daily (with food).   Refills:  0     dolutegravir 50 MG Tabs  Commonly known as:  TIVICAY  Take 50 mg by mouth.   Refills:  0     doravirine 100 MG Tabs  Commonly known as:  PIFELTRO  Take 100 mg by mouth daily.   Refills:  0     finasteride 5 MG tablet  Commonly known as:  PROSCAR  Take 5 mg by mouth daily.   Refills:  0     FLOMAX 0.4 MG capsule  Take 0.8 mg by mouth nightly.  Generic drug:  tamsulosin   Refills:  0     hydrochlorothiazide 12.5 MG tablet  Commonly known as:  HYDRODIURIL  Take 12.5 mg by mouth daily.   Refills:  0     rosuvastatin 20 MG tablet  Commonly known as:  CRESTOR  Take 10 mg by mouth  daily.   Refills:  0        STOP taking these medications    cetirizine 10 MG tablet  Commonly known as:  ZYRTEC     dulaglutide 1.5 MG/0.5ML injection pen  Commonly known as:  TRULICITY     glipiZIDE 5 MG tablet  Commonly known as:  GLUCOTROL     metFORMIN 500 MG tablet  Commonly known as:  GLUCOPHAGE           Where to Get Your Medications      Information about where to get these medications is not yet available    Ask your nurse or doctor about these medications   cefTRIAXone (ROCEPHIN) 1,000 mg in sodium chloride 50 mL IVPB   insulin glargine 100 UNIT/ML injection   insulin lispro 100 units/mL injection   insulin lispro 100 units/mL injection   nystatin 100,000 units/mL suspension   polyethylene glycol powder   senna 8.6 MG tablet         Allergies:  No Known Allergies    Discharge Disposition:  Acute care hospital.    Discharge Code Status:  Full code / full care  This code status is not changed from the time of admission.    Follow Up Appointments:    Scheduled appointments:  No future appointments.    For  appointments requested for after discharge that have not yet been scheduled, refer to the Post Discharge Referrals section of the After Visit Summary.    Discharging Physician's Contact Information:  [facility] phone triage at [phone].      I have evaluated the patient today; he will be discharged from the hospital today.     Today, his physical examination is notable for sittin in bed in NAD,  heart rrr no mgr, lungs CTAB,normal wob, abd soft nt nd, trace LEE, oriented x 2    Please refer to the Discharge Summary for further details.    I spent 20 minutes on the patient's care unit in the preparation and execution of the hospital discharge.    Dylana Shaw

## 2017-12-09 NOTE — Interdisciplinary (Signed)
12/09/17 1512   Discharge Planning Needs   Does this patient have CM discharge planning needs? Yes   CM Needs Met? Yes   Does this patient have SW discharge planning needs? Yes   SW Needs Met? Yes   Final Discharge Destination   Final Discharge Destination Other (Comment)  ([place], [place])   Discharge Dance movement psychotherapist Arrangements [place] Yes     Patient is transfering today at 1530 to the [facility]. This Case Manager has set up transfer with the [facility] transfer [place]. I spoke with [employer]. Dr. Otilio Saber is the accepting doctor, patient is 100% [facility] service qualified, our attending confirmed that patient would like to transfer to the [place]. Patient will be on the Fifth floor, (5E)  at the [place]. Velma RN at [facility] was given phone number of the CR [phone] to call report. Main number of hospital is [phone]. Extention; [phone].     Ellison Hughs RN, BSN, CCM

## 2017-12-09 NOTE — Plan of Care (Signed)
Problem: Promotion of Health and Safety  Goal: Promotion of Health and Safety  Description  The patient remains safe, receives appropriate treatment and achieves optimal outcomes (physically, psychosocially, and spiritually) within the limitations of the disease process by discharge.    Information below is the current care plan.  Outcome: Progressing  Flowsheets  Taken 12/09/2017 0103 by Sandy Salaamhomas, Waneda Klammer, RN  Guidelines: Inpatient Nursing Guidelines  Individualized Interventions/Recommendations #3 (if applicable): Condom catheter intact and draining  Individualized Interventions/Recommendations #5 (if applicable): Patient is transferred to  air mattress bed, encouarged and assisted the patient to turn q2hrly  Outcome Evaluation (rationale for progressing/not progressing) every shift: Patient is resting, refused hygiene care, condom catheter intact and draining  Taken 12/08/2017 1729 by Rosiland OzWalker, Sally Ann, RN  Individualized Interventions/Recommendations #1: Provide hygiene and skin care.  Taken 12/08/2017 0202 by Sandy Salaamhomas, Nixie Laube, RN  Individualized Interventions/Recommendations #2 (if applicable): Maintaind fall precautions  Individualized Interventions/Recommendations #4 (if applicable): Provide cluster care and instructed the patient to call for assistance

## 2017-12-09 NOTE — Interdisciplinary (Signed)
[facility] [facility] [place] [facility] [place].  [place] in no acute distress[place] [facility].  Pt was sent w/ his iv access, per RN Vi's request.  All personal belonging sent with the pt.

## 2017-12-09 NOTE — Progress Notes (Shared)
[facility] day:   2 days - Admitted on: 12/07/2017    Brendan Huerta is a 72 year old male with a PMH significant for DM2, HTN, CVAx3 (07/2017 most recent), and HIV (CD4 276 06/2017) who presented with likely rhabdomyolysis and AKI d/t severe volume depletion and right sided buttocks and hip pressure ulcers s/p ground level fall and prolonged down without food or drink for up to a week, likely 2-3 days. Found to have blood cultures positive for proteus species.    24 Hour - Interval Events   No acute events overnight     Subjective   Somnolent a little altered. Denied all symptoms including pain, says that he feels better.    ROS: Responded no to headache, fever, chills, chest pain, shortness of breath, and cough.       Physical Exam   Temp  Min: 98.2 F (36.8 C)  Max: 98.8 F (37.1 C)  Pulse  Min: 82  Max: 108  BP  Min: 102/64  Max: 133/73  Resp  Min: 16  Max: 18  SpO2  Min: 96 %  Max: 98 %      BP 133/73 (BP Location: Left arm, BP Patient Position: Semi-Fowlers)   Pulse 82   Temp 98.2 F (36.8 C)   Resp 16   Ht 6' 1"  (1.854 m)   Wt 92.4 kg (203 lb 12.8 oz)   SpO2 96%   BMI 26.89 kg/m  O2 Device: None (Room air)      08/19 0600 - 08/20 0559  In: 3086 [P.O.:1673; I.V.:103]  Out: 725 [Urine:725]  Urine x 1 Stool x 1 Emesis x 1      Gen: Somnolent, difficult to arouse for exam, NAD  HEENT: Hazy Cataracts, injected sclera, PERRL 3->2  Neck: Supple, no LAD, JVP flat  CV:  Tachycardic and regular rhythm, no murmurs, clicks, or gallops.  Resp: Patient could not sit up without pain, clear to auscultation of lateral and anterior lung fields, normal work of breathing, shallow breaths.  Abdomen: BS normal.  Abdomen soft, non-tender.  No masses or organomegaly appreciated.  Extremities: Trace lower extremity edema, warm and well perfused, 2+ pulses  - Right buttocks sacral ulcer about 4cm and darker. Left buttocks has an evolving deep tissue injury, currently intact. dark meaty center.  Scattered deep tissue injuries along the midline back.   Neuro: A&Ox2 (Responded yes to are you in hospital, did no know which hospital). Somnolent  RUE 4/5 strength with shoulder Abduction, flexion/extension at elbow.   LUE 4+/5 Strength with shoulder abduction, flexion/extension at elbow  Grip strength greater on left than right, unchanged from yesterday   LE neuro exam limited by pain  Crude sensory exam equivalent bilaterally.    Pertinent Labs and Imaging and Meds       WBC 8.2 (08/20) HGB 14.0 (08/20) PLT 71* (08/20)    HCT 42.4 (08/20)      %Neutrophils 70 (08/20) %Bands 2 (08/19) %Lymphs 17 (08/20) %Monos 11 (08/20) %Eos 2 (08/20) %Basos 0 (08/20)  Na 143 (08/20) CL 106 (08/20) BUN 27* (08/20) GLU   240* (08/20)   K 3.9 (08/20) CO2 27 (08/20) Cr 1.08 (08/20)      Brewer 8.3* (08/20) Mg 2.5* (08/19) Phos 2.8 (08/19)  TP 6.4 (08/20) ALT 53* (08/20) TBILI 1.75* (08/20) ALK PHOS  70 (08/20)   ALB 2.8* (08/20) AST 90* (08/20) DBILI 0.5* (08/19)  Lactate 1.8 (08/19)      Recent Labs     12/08/17  1209 12/08/17  1719 12/08/17  1959 12/09/17  0745   GLUCPOCT 229* 226* 248* 220*     POC Glucose (mg/dL)  Avg: 230.8 mg/dL  Min: 220 mg/dL  Max: 248 mg/dL    Lactate 8/18:  2.2 -> 3.1 - >> 1.8 on 8/19    Troponin 8/18   28 -> 23    CPK  8/18:      8/19       8/20          8,679 -> 7,749  -> 3,640    Blood Culture 8/18  Blood Culture Result Abnormal  Anaerobic Bottle Gram stain:   Gram negative rods   Called to and read back by:_Sonya rn   By HKV:[id number] 12/08/2017 03:02  CALM   Blood Culture Abnormal  PROTEUS SPECIES DNA DETECTED   Identifiable resistance marker DNA NOT DETECTED        Scheduled Medications  . aspirin  81 mg Daily   . cefTRIAXone (ROCEPHIN) IV  1,000 mg Q24H NR   . darunavir-cobicistat  1 tablet Daily with food   . dolutegravir  50 mg Daily   . doravirine  100 mg Daily   . finasteride  5 mg Daily   . heparin  5,000 Units Q8H   . insulin lispro  1-10 Units TID AC   . nystatin  5 mL 4x Daily   . senna   2 tablet HS   . sodium chloride (PF)  3 mL Q8H     Continuous Medications  . sodium chloride     . sodium chloride       PRN Medications  . dextrose  12.5 g PRN   . glucagon  1 mg Once PRN   . glucose  4 tablet PRN   . glucose  1 Tube PRN   . nalOXone  0.1 mg Q2 Min PRN   . polyethylene glycol  17 g Q24H PRN   . sodium chloride (PF)  3 mL PRN   . sodium chloride   Continuous PRN   . sodium chloride   Continuous PRN   . traMADol  50 mg Q4H PRN       Assessment / Plan   Brendan Huerta is a 72 year old male with a PMH significant for DM2, HTN, CVAx3 (07/2017 most recent), and HIV (CD4 276 06/2017) who presented with likely rhabdomyolysis and AKI d/t severe volume depletion and right sided buttocks and hip pressure ulcers s/p ground level fall and prolonged down without food or drink for up to a week, likely 2-3 days. Found to have blood cultures positive for proteus species.    #Significant Volume Depletion:  #AKI vs CKD: Creatine down trend 1.53 -> 1.17 -> 1.08 now, BUN down trend, remains elevated at 27. Urine output improved w/ON NS infusion, still low.  #Elevated Lactate (resolved): Now 1.8, was likely due to significant volume depletion and rhabdo, however blood culture positive for proteus, an unlikely contaminant. Source UTI or GI, reports blood in stool, likely gastroenteritis.   [ ]  f/u blood culture   #Rhabdomyolysis: Improving. CPK 8,679 -> 7,749 -> 6,899 -> 3,640 (s/p fluids). AST 214 -> 138 -> 90. Tbili 2.26 -> 1.88 -> 1.75. + blood on U/A likely d/t myoglobinuria, RBC only 3-5.   #Elevated Hemoglobin: Down trend Hg 19.6 -> 15.6 -> 14.0, likely hemoconcentration.  - Repeat CBC   -  Daily CMP to trend creatinine, LFTs, lytes and BUN  - D/c daily CPK    #Proteus Bacteremia: Afebrile with unclear source. Was found down in his own urine with multiple open wounds. Stated he had blood covered stool in past month on ROS. No LE or nitrites on U/A. WBC spike at 10.9 8/19, now wnl at 8.2, may be falsely normal in setting  of HIV. Does not meet SIRS, HR improved.  - Continue Ceftriaxone  [ ]  F/u Repeat blood culture  [ ]  F/u sensitivities     #Deep tissue pressure wound Right Buttock, right hip, and back: Developed after fall and lying immobile on right side, pictures in wound care  - Tramadol 62m PRN q4 for pain  - Wound care for dressing changes and management     #[facility]. Received 3 units of ISS past 24 hours, [facility] 100units Lantus qday.   - Insulin sliding scale  [ ]  Titrate Lantus up tomorrow    #HIV: CD4 of 276 06/2017, thrush on exam.  - Resume home regimen: Prezcobix, Tivicay, Pifeltro    #Thrush: On exam  - Nystatin QID    #Troponemia (Resolved): Down trended 28 -> 23, likely due to volume depletion. Patient denies cardiac symptoms, EKG showed nonspecific ST changes.     #Bowel Prep:  -Senna and miralax PRN    #History of CVA: Right hemiplegia: 4/5 strength in RUE and 2+/5 strength in RLE (limited by pain and extreme deconditioning) compared to 4+/5 strength in LUE and 3/5 strength in LLE. Mild spasticity CT head showed no acute findings   - Monitor for new onset focal neurologic findings    #FTT and Fall risk: Patient with HIV, unknown CD4 count, without food or drink for 1 week, extreme deconditioning and weakness 2/2 multiple CVAs.   - Carb limited Diet  - Boost    - Daily BMP to monitor lyte recover  [ ]  F/u social work and case management, will likely need placement  [ ]  F/u PT eval  [ ]  Consider Nutrition Consult    Chronic Conditions:  #BPH: Finasteride, hold Flomax d/t fluid status  #CVD: Resume home aspirin  #HTN: Hold HCTZ d/t low fluid status    Discharge planning: clinical improvement  Foley: External urinary catheter  VTE prophylaxis: SQ heparin  Diet: Diet Therapeutic; Carbohydrate Limited  Nutritional Supplement Boost Glucose Control; Strawberry (BCG); Deliver Supplements: TID  Last BM:    IV fluids: Normal Saline at 50 ml/hr  Access:  PIVs  Code status: Full Code    The history and exam was performed under the direct supervision of Dr. RQueen Blossom The case was discussed in detail and clinical decisions were made in conjuction with Dr. RQueen Blossom the attending of record.     AHarrison Mons MS4

## 2017-12-09 NOTE — Interdisciplinary (Signed)
Dr Caswell CorwinAnders, Alpha was text paged:  rm [phone] [facility] 1530. Once you finalized his d/c orders, can you pls come and sign his D/C Rx? Thanks

## 2017-12-10 LAB — GLYCOSYLATED HGB(A1C), BLOOD: Glyco Hgb (A1C): 5.2 % (ref 4.8–5.8)

## 2017-12-13 LAB — BLOOD CULTURE
Blood Culture Result: NO GROWTH
Blood Culture Result: NO GROWTH
Blood Culture: DETECTED — AB

## 2019-04-22 ENCOUNTER — Emergency Department (HOSPITAL_BASED_OUTPATIENT_CLINIC_OR_DEPARTMENT_OTHER)

## 2019-04-22 ENCOUNTER — Emergency Department (EMERGENCY_DEPARTMENT_HOSPITAL)

## 2019-04-22 ENCOUNTER — Emergency Department
Admission: EM | Admit: 2019-04-22 | Discharge: 2019-04-23 | Disposition: A | Discharge status: Home / Self Care | Attending: Emergency Medicine | Admitting: Emergency Medicine

## 2019-04-22 DIAGNOSIS — M4802 Spinal stenosis, cervical region: Secondary | ICD-10-CM

## 2019-04-22 DIAGNOSIS — W19XXXA Unspecified fall, initial encounter: Secondary | ICD-10-CM

## 2019-04-22 DIAGNOSIS — M5031 Other cervical disc degeneration,  high cervical region: Secondary | ICD-10-CM

## 2019-04-22 DIAGNOSIS — W1811XA Fall from or off toilet without subsequent striking against object, initial encounter: Secondary | ICD-10-CM | POA: Insufficient documentation

## 2019-04-22 DIAGNOSIS — Z043 Encounter for examination and observation following other accident: Secondary | ICD-10-CM

## 2019-04-22 DIAGNOSIS — R109 Unspecified abdominal pain: Secondary | ICD-10-CM | POA: Insufficient documentation

## 2019-04-22 DIAGNOSIS — R519 Headache, unspecified: Secondary | ICD-10-CM

## 2019-04-22 DIAGNOSIS — M47812 Spondylosis without myelopathy or radiculopathy, cervical region: Secondary | ICD-10-CM

## 2019-04-22 DIAGNOSIS — W1812XA Fall from or off toilet with subsequent striking against object, initial encounter: Secondary | ICD-10-CM

## 2019-04-22 DIAGNOSIS — Z87891 Personal history of nicotine dependence: Secondary | ICD-10-CM | POA: Insufficient documentation

## 2019-04-22 DIAGNOSIS — W2201XA Walked into wall, initial encounter: Secondary | ICD-10-CM | POA: Insufficient documentation

## 2019-04-22 LAB — URINALYSIS WITH CULTURE REFLEX, WHEN INDICATED
Bilirubin: NEGATIVE
Blood: NEGATIVE
Ketones: NEGATIVE
Leuk Esterase: NEGATIVE Leu/uL
Nitrite: NEGATIVE
Specific Gravity: 1.022 (ref 1.002–1.030)
pH: 7 (ref 5.0–8.0)

## 2019-04-22 LAB — ECG 12-LEAD
ATRIAL RATE: 81 {beats}/min
ECG INTERPRETATION: NORMAL
P AXIS: 42 degrees
PR INTERVAL: 128 ms
QRS INTERVAL/DURATION: 74 ms
QT: 372 ms
QTc (Bazett): 432 ms
R AXIS: -36 degrees
T AXIS: -6 degrees
VENTRICULAR RATE: 81 {beats}/min

## 2019-04-22 LAB — CBC WITH DIFF, BLOOD
ANC-Automated: 3.9 10*3/uL (ref 1.6–7.0)
Abs Basophils: 0 10*3/uL
Abs Eosinophils: 0.1 10*3/uL (ref 0.0–0.5)
Abs Lymphs: 1.6 10*3/uL (ref 0.8–3.1)
Abs Monos: 0.5 10*3/uL (ref 0.2–0.8)
Basophils: 0 %
Eosinophils: 2 %
Hct: 43 % (ref 40.0–50.0)
Hgb: 14.1 gm/dL (ref 13.7–17.5)
Lymphocytes: 26 %
MCH: 32.5 pg — ABNORMAL HIGH (ref 26.0–32.0)
MCHC: 32.8 g/dL (ref 32.0–36.0)
MCV: 99.1 um3 — ABNORMAL HIGH (ref 79.0–95.0)
MPV: 10.7 fL (ref 9.4–12.4)
Monocytes: 8 %
Plt Count: 103 10*3/uL — ABNORMAL LOW (ref 140–370)
RBC: 4.34 10*6/uL — ABNORMAL LOW (ref 4.60–6.10)
RDW: 12.9 % (ref 12.0–14.0)
Segs: 64 %
WBC: 6.1 10*3/uL (ref 4.0–10.0)

## 2019-04-22 LAB — BASIC METABOLIC PANEL, BLOOD
Anion Gap: 10 mmol/L (ref 7–15)
BUN: 14 mg/dL (ref 8–23)
Bicarbonate: 25 mmol/L (ref 22–29)
Calcium: 8.7 mg/dL (ref 8.5–10.6)
Chloride: 105 mmol/L (ref 98–107)
Creatinine: 1.43 mg/dL — ABNORMAL HIGH (ref 0.67–1.17)
GFR: 58 mL/min
Glucose: 122 mg/dL — ABNORMAL HIGH (ref 70–99)
Potassium: 3.9 mmol/L (ref 3.5–5.1)
Sodium: 140 mmol/L (ref 136–145)

## 2019-04-22 MED ORDER — FENTANYL CITRATE (PF) 50 MCG/ML IJ SOLN (WRAPPED RECORD) ~~LOC~~
25.0000 ug | Freq: Once | INTRAMUSCULAR | Status: AC
Start: 2019-04-22 — End: 2019-04-22
  Administered 2019-04-22 (×2): 25 ug via INTRAVENOUS
  Filled 2019-04-22: qty 1

## 2019-04-22 NOTE — EMS Narrative (Addendum)
Pt Age: 73 Years; Gender: Male; Is there any reason to suspect patient may   HAVE or may have been EXPOSED to COVID-19: No; Provide specific details for   suspecting patient may HAVE or have been EXPOSED to COVID-19: ;  Primary Impression: Traumatic Injury;  Medical History: MS: Rhabdomyolysis, Dehydration-E86.0, Hypertension (HTN),   Endocrine:  Diabetes Type 2; Pt Medication: Polyethylene Glycol 3350 Or   Pack, Tamsulosin Hcl 0.4 Mg Po Caps, Rosuvastatin calcium 20 MG Oral   Tablet, Dulaglutide 1.5 Mg/0.72ml Sc Sopn, Dolutegravir Sodium 50 Mg Po   Tabs, Insulin Glargine 100 Unit/Ml Sc Soln, Senna 8.6 Mg Or Tabs,   Darunavir-Cobicistat 800-150 Mg Po Tabs, Metformin Hcl 500 Mg Or Tabs,   CETIRIZINE HCL (CETIRIZINE HCL), 10MG , TABLET, ORAL, PERRIGO CO., 300 ea.   BOTTLE, Hydrochlorothiazide 12.5 MG Oral Tablet, Aspirin 81 MG Oral Tablet,   Doravirine 100 Mg Tablet, Insulin Lispro (Human) 100 Unit/Ml Sc Soln,   Nystatin 100000 UNT/ML Oral Suspension, Finasteride 5 MG Oral Tablet,   Glipizide 5 MG Oral Tablet; Pt Allergies: ;  Base Called: [facility]    CC:  Date/Time of Symptom Onset: 2020-12-31T18:48:16.000-08:00  Complaint Type: Chief (Primary)  Complaint: Head pain post fall  Chief Complaint Anatomic Location: Head    HPI:  Primary Symptom: Headache  Provider's Primary Impression: Injury, unspecified  Initial Patient Acuity: Lower Acuity Nyoka Cowden)    Alert:  Patient Care Report Number: [id number]  Incident Number: [id number]  EMS Vehicle (Unit) Number: [id number]  EMS Unit Call Sign: [id number]  Level of Care of This Unit: ALS-Paramedic  Incident Location Type: Unsp non-institutional (private) residence as place  Incident Street Address: [place]: [id number]  Incident ZIP Code: [id number]    Assessment:    Procedure - Arrest:  Cardiac Arrest: No    Procedure - Exam:    Procedure - Injury:    Procedure - Airway:    Procedure - Medications:    Procedure - Generic:  Date/Time Procedure Performed:  2020-12-31T18:54:38.000-08:00  Procedure: [id number]  Date/Time Procedure Performed: 2020-12-31T18:54:38.000-08:00  Procedure: [id number]  Date/Time Procedure Performed: 2020-12-31T18:54:38.000-08:00  Procedure: [id number]    Demographics History:  Current Medications: 7793903  Current Medications: 009233  Current Medications: 007622  Current Medications: 6333545  Current Medications: 625638  Current Medications: 9373428  Current Medications: 768115  Current Medications: 726203  Current Medications: 559741  Current Medications: 638453  Current Medications: 242120  Current Medications: 646803  Current Medications: 212248  Current Medications: 250037  Current Medications: 048889  Current Medications: 1694503  Current Medications: 8882800    Demographics Practitioner:    Demographics Patient:  Last Name: Steidle  First Name: [facility]  Patient's Home Address: [address] [place]: [id number]  [place]: [phone]  Patient's Home State: 06  Patient's Home ZIP Code: [id number]  Patient's Country of Residence: [place]  Gender: Male  Race: Black or African American  Age: 81  Age Units: Years    Demographics Times:  Unit Notified by Dispatch Date/Time: 2020-12-31T18:37:34.000-08:00  Unit En Route Date/Time: 2020-12-31T18:37:55.000-08:00  Unit Arrived on Scene Date/Time: 2020-12-31T18:40:39.000-08:00  Arrived at Patient Date/Time: 2020-12-31T18:48:14.000-08:00  Unit Left Scene Date/Time: 2020-12-31T19:05:59.000-08:00    Demographics Payment:  Primary Method of Payment: Insurance

## 2019-04-22 NOTE — ED MD Progress Note (Signed)
Chief Complaint   Patient presents with   . Falls     Patient to ED for fall, was transferring from commode to wheelchair and fell backwards. Patient reports right rib pain and right occipital pain. Denies LOC or thinners. AAOX4. Patient was placed in ccollar by medics but self removed it.      Fall from commode with head strike no loc    ua pending   CT Head W/O Contrast    (Results Pending)   CT Chest With Contrast    (Results Pending)   CT Abdomen And Pelvis With Contrast    (Results Pending)   CT C-Spine W/O Contrast    (Results Pending)     Results for orders placed or performed during the hospital encounter of 26/94/85   Basic Metabolic Panel, Blood Green Plasma Separator Tube   Result Value Ref Range    Glucose 122 (H) 70 - 99 mg/dL    BUN 14 8 - 23 mg/dL    Creatinine 1.43 (H) 0.67 - 1.17 mg/dL    GFR 58 mL/min    Sodium 140 136 - 145 mmol/L    Potassium 3.9 3.5 - 5.1 mmol/L    Chloride 105 98 - 107 mmol/L    Bicarbonate 25 22 - 29 mmol/L    Anion Gap 10 7 - 15 mmol/L    Calcium 8.7 8.5 - 10.6 mg/dL   CBC w/ Diff Lavender   Result Value Ref Range    WBC 6.1 4.0 - 10.0 1000/mm3    RBC 4.34 (L) 4.60 - 6.10 mill/mm3    Hgb 14.1 13.7 - 17.5 gm/dL    Hct 43.0 40.0 - 50.0 %    MCV 99.1 (H) 79.0 - 95.0 um3    MCH 32.5 (H) 26.0 - 32.0 pgm    MCHC 32.8 32.0 - 36.0 g/dL    RDW 12.9 12.0 - 14.0 %    MPV 10.7 9.4 - 12.4 fL    Plt Count 103 (L) 140 - 370 1000/mm3    Segs 64 %    Lymphocytes 26 %    Monocytes 8 %    Eosinophils 2 %    Basophils 0 %    ANC-Automated 3.9 1.6 - 7.0 1000/mm3    Abs Lymphs 1.6 0.8 - 3.1 1000/mm3    Abs Monos 0.5 0.2 - 0.8 1000/mm3    Abs Eosinophils 0.1 0.0 - 0.5 1000/mm3    Abs Basophils 0.0 0.0 1000/mm3    Diff Type Automated

## 2019-04-22 NOTE — ED Notes (Signed)
04/22/2019 8:11 PM Brendan Huerta    An EKG was handed to Dr. Zenia Resides

## 2019-04-22 NOTE — ED Notes (Signed)
Pt refusing iv and labs at this time. md made aware

## 2019-04-22 NOTE — ED EKG Interpretation (Signed)
ED EKG Interpretation    EKG: Normal Sinus Rhythm with Left Axis deviation and nonspecific ST and T wave changes.

## 2019-04-22 NOTE — ED Provider Notes (Signed)
Emergency Department Note  [facility] electronic medical record reviewed for pertinent medical history.     Nursing Triage Note:   Chief Complaint   Patient presents with   . Falls     Patient to ED for fall, was transferring from commode to wheelchair and fell backwards. Patient reports right rib pain and right occipital pain. Denies LOC or thinners. AAOX4. Patient was placed in ccollar by medics but self removed it.      HPI:   73 year old male with a PMH significant for DM on insulin, CHF, HTN, HIV+ on HAART, 3 strokes with residual weakness in left arm and bilateral lower extremities, presenting today after mechanical fall from commode while trying to transfer and having the wheelchair slip out from under him. Patient fell to his Right, hit his head against the wall and his Right side on the floor. Did not lose consciousness. Did not feel lightheaded before the fall. Denies neck pain currently.    Currently denies CP, SOB, f/c/n/v.    Patient is primarily managed at the [facility] and is upset with EMS for bringing him to [facility] rather than to the [facility].     Pshx notable for emergent surgery for ruptured pelvic ulcer "shortly after I got back form [place], I almost died".     Past Medical History:   Diagnosis Date   . CVA (cerebral vascular accident) (CMS-HCC) 07/2017    Left sided stroke, right motor dysfunction    . CVA (cerebral vascular accident) (CMS-HCC)     3 total   . DM2 (diabetes mellitus, type 2) (CMS-HCC)    . HIV disease (CMS-HCC)    . HTN (hypertension)      No past surgical history on file.    Family History:  Non-contributory    No family history on file.    Social History:  -tob, -EtOH, [place]    Social History     Tobacco Use   . Smoking status: Former Smoker     Packs/day: 4.00     Years: 37.00     Pack years: 148.00     Types: Cigarettes     Quit date: 1997     Years since quitting: 24.0   . Smokeless tobacco: Never Used   Substance Use Topics   . Alcohol use: Not Currently     Frequency: 4 or more  times a week     Drinks per session: 10 or more     Comment: Stopped drinking in 1996   . Drug use: Not Currently     Comment: Former marijuana, cocaine, crystal meth      Medications:   Prior to Admission Medications   Prescriptions Last Dose Informant Patient Reported? Taking?   aspirin 81 MG tablet   Yes No   Sig: Take 81 mg by mouth daily.   cefTRIAXone (ROCEPHIN) 1,000 mg in sodium chloride 50 mL IVPB   No No   Sig: Inject 1,000 mg into vein every 24 hours Indications: [place] BLOODSTREAM INFECTION.   darunavir-cobicistat (PREZCOBIX) 800-150 MG tablet   Yes No   Sig: Take 1 tablet by mouth daily (with food).   dolutegravir (TIVICAY) 50 MG TABS   Yes No   Sig: Take 50 mg by mouth.   doravirine (PIFELTRO) 100 MG TABS   Yes No   Sig: Take 100 mg by mouth daily.   finasteride (PROSCAR) 5 MG tablet   Yes No   Sig: Take 5 mg  by mouth daily.   hydrochlorothiazide (HYDRODIURIL) 12.5 MG tablet   Yes No   Sig: Take 12.5 mg by mouth daily.   insulin glargine (LANTUS) 100 UNIT/ML injection   No No   Sig: Inject 22 Units under the skin daily (before lunch).   insulin lispro (HUMALOG OR ADMELOG) 100 units/mL injection   No No   Sig: Inject 1-10 Units under the skin 4 times daily (with meals and nightly).   insulin lispro (HUMALOG OR ADMELOG) 100 units/mL injection   No No   Sig: Inject 6 Units under the skin 3 times daily (with meals).   polyethylene glycol (GLYCOLAX) powder   No No   Sig: Take 17 g by mouth daily.   rosuvastatin (CRESTOR) 20 MG tablet   Yes No   Sig: Take 10 mg by mouth daily.   senna (SENOKOT) 8.6 MG tablet   No No   Sig: Take 2 tablets (17.2 mg) by mouth at bedtime.   tamsulosin (FLOMAX) 0.4 MG capsule   Yes No   Sig: Take 0.8 mg by mouth nightly.      Facility-Administered Medications: None     Allergies: Patient has no known allergies.    Review of Systems:   Review of Systems   Constitutional: Negative for chills and fever.   HENT: Negative for congestion.    Eyes: Negative.    Respiratory: Negative for  cough and shortness of breath.    Cardiovascular: Negative for chest pain.   Gastrointestinal: Negative for abdominal pain, blood in stool, constipation, diarrhea and nausea.   Endocrine: Negative.    Genitourinary: Positive for flank pain. Negative for dysuria.   Musculoskeletal:        Weakness in LEs bilaterally, also left arm related to prior strokes   Skin: Negative.    Allergic/Immunologic: Negative.    Neurological:        Weakness in LEs, Right > Left, also in Left arm related to prior strokes   Hematological: Negative.    Psychiatric/Behavioral: Negative.      All other systems reviewed and negative unless otherwise noted in the HPI or above. This was done per my custom and practice for systems appropriate to the chief complaint in an emergency department setting and varies depending on the quality of history that the patient is able to provide.      Physical Exam:   04/22/19  2008   BP: 153/88   Pulse: 83   Resp: 16   Temp: 98 F (36.7 C)   SpO2: 99%     Nursing note and vitals reviewed.     Physical Exam  HENT:      Head: Normocephalic.      Comments: Small cephalohematoma overlying Right occiput     Mouth/Throat:      Mouth: Mucous membranes are moist.      Pharynx: Oropharynx is clear.      Comments: Dentures upper and lower  Cardiovascular:      Rate and Rhythm: Normal rate and regular rhythm.      Comments: Heart sounds distant but otherwise unremarkable  Pulmonary:      Effort: Pulmonary effort is normal.      Breath sounds: Normal breath sounds.   Abdominal:      General: Abdomen is flat.      Palpations: Abdomen is soft.      Tenderness: There is right CVA tenderness. There is no left CVA tenderness.   Musculoskeletal:  General: No tenderness or deformity.      Comments: Chest non-ttp  Contractures in LEs bilaterally, unable to fully extend hips or knees. Strength 4/5 Left and 2/5 Right in LEs. LUE 4/5 strength, RUE 5/5, sensation intact.   Skin:     General: Skin is warm and dry.       Capillary Refill: Capillary refill takes 2 to 3 seconds.      Findings: No erythema or rash.   Neurological:      Mental Status: He is oriented to person, place, and time. Mental status is at baseline.      Cranial Nerves: No cranial nerve deficit.   Psychiatric:         Mood and Affect: Mood normal.         Behavior: Behavior normal.       Workup Review:    Lab Results   Component Value Date    WBC 6.1 04/22/2019    RBC 4.34 (L) 04/22/2019    HGB 14.1 04/22/2019    HCT 43.0 04/22/2019    MCV 99.1 (H) 04/22/2019    MCHC 32.8 04/22/2019    RDW 12.9 04/22/2019    PLT 103 (L) 04/22/2019    MPV 10.7 04/22/2019     Lab Results   Component Value Date    NA 140 04/22/2019    K 3.9 04/22/2019    CL 105 04/22/2019    BICARB 25 04/22/2019    BUN 14 04/22/2019    CREAT 1.43 (H) 04/22/2019    GLU 122 (H) 04/22/2019    Clarence 8.7 04/22/2019     CT head, C spine, chest, abdomen pending    ua pending    Impression & Initial ED Plan:  73 year old  male presents with Right head pain and Right flank pain after fall form seating. Fall does not appear syncopal / provoked other than patient has difficulty mobilizing at baseline due to sequelae of prior strokes, primarily LE weakness bilaterally. No LOC. Pain in Right flank concerning for renal injury for which ua ordered and abdominal imaging. Trauma wu indicated including CT head/C-spine/chest also ordered to evaluate for other occult injury.    The rest of the ED course, results, and plan for the patient is in a separate continuation note. Please see that note for details.     I have discussed my evaluation and care plan for the patient with the attending physician Dr. Jeannine Kitten.    Bobbye Riggs MD MS  Resident - Otolaryngology Head and Neck Surgery  PGY-1     Bobbye Riggs, MD  Resident  04/22/19 [id number]       Jay Schlichter, MD  04/24/19 [phone]

## 2019-04-23 ENCOUNTER — Emergency Department (EMERGENCY_DEPARTMENT_HOSPITAL)

## 2019-04-23 DIAGNOSIS — W1812XA Fall from or off toilet with subsequent striking against object, initial encounter: Secondary | ICD-10-CM

## 2019-04-23 DIAGNOSIS — R0781 Pleurodynia: Secondary | ICD-10-CM

## 2019-04-23 DIAGNOSIS — R109 Unspecified abdominal pain: Secondary | ICD-10-CM

## 2019-04-23 DIAGNOSIS — J432 Centrilobular emphysema: Secondary | ICD-10-CM

## 2019-04-23 DIAGNOSIS — J438 Other emphysema: Secondary | ICD-10-CM

## 2019-04-23 MED ORDER — IOHEXOL 350 MG/ML IV SOLN
100.0000 mL | Freq: Once | INTRAVENOUS | Status: AC
Start: 2019-04-23 — End: 2019-04-23
  Administered 2019-04-23: 100 mL via INTRAVENOUS
  Filled 2019-04-23: qty 100

## 2019-04-23 NOTE — ED Notes (Signed)
Discharge instructions reviewed with pt. Included s/s of when to seek emergency care again, follow up care, and medication regimen. Pt's AOx4, ambulates with steady gait and verbalized understanding of all care. PIV/wristband removed, and AVS signed.      Report given to BLS. Pt states he has keys for apartment building

## 2019-04-23 NOTE — ED Notes (Signed)
Patient transported to CT scan via gurney.

## 2019-04-26 DIAGNOSIS — M5441 Lumbago with sciatica, right side: Secondary | ICD-10-CM | POA: Diagnosis not present

## 2019-04-26 DIAGNOSIS — M9902 Segmental and somatic dysfunction of thoracic region: Secondary | ICD-10-CM | POA: Diagnosis not present

## 2019-04-26 DIAGNOSIS — M9903 Segmental and somatic dysfunction of lumbar region: Secondary | ICD-10-CM | POA: Diagnosis not present

## 2019-04-26 DIAGNOSIS — M9905 Segmental and somatic dysfunction of pelvic region: Secondary | ICD-10-CM | POA: Diagnosis not present

## 2019-04-28 DIAGNOSIS — M5441 Lumbago with sciatica, right side: Secondary | ICD-10-CM | POA: Diagnosis not present

## 2019-04-28 DIAGNOSIS — M9903 Segmental and somatic dysfunction of lumbar region: Secondary | ICD-10-CM | POA: Diagnosis not present

## 2019-04-28 DIAGNOSIS — M9905 Segmental and somatic dysfunction of pelvic region: Secondary | ICD-10-CM | POA: Diagnosis not present

## 2019-04-28 DIAGNOSIS — M9902 Segmental and somatic dysfunction of thoracic region: Secondary | ICD-10-CM | POA: Diagnosis not present

## 2019-04-29 DIAGNOSIS — M48061 Spinal stenosis, lumbar region without neurogenic claudication: Secondary | ICD-10-CM | POA: Diagnosis not present

## 2019-05-04 DIAGNOSIS — M545 Low back pain: Secondary | ICD-10-CM | POA: Diagnosis not present

## 2019-05-11 DIAGNOSIS — M4316 Spondylolisthesis, lumbar region: Secondary | ICD-10-CM | POA: Diagnosis not present

## 2019-05-11 DIAGNOSIS — M5126 Other intervertebral disc displacement, lumbar region: Secondary | ICD-10-CM | POA: Diagnosis not present

## 2019-05-11 DIAGNOSIS — M48062 Spinal stenosis, lumbar region with neurogenic claudication: Secondary | ICD-10-CM | POA: Diagnosis not present

## 2019-05-11 DIAGNOSIS — M47816 Spondylosis without myelopathy or radiculopathy, lumbar region: Secondary | ICD-10-CM | POA: Diagnosis not present

## 2019-05-11 DIAGNOSIS — M549 Dorsalgia, unspecified: Secondary | ICD-10-CM | POA: Diagnosis not present

## 2019-05-13 ENCOUNTER — Other Ambulatory Visit: Payer: Self-pay | Admitting: Neurosurgery

## 2019-05-17 ENCOUNTER — Other Ambulatory Visit: Payer: Self-pay | Admitting: Neurosurgery

## 2019-05-17 NOTE — Pre-Procedure Instructions (Signed)
Nathan Turner  05/17/2019      [facility] - [place], [place] [address] Phone: [phone] Fax: [phone]    Your procedure is scheduled on Jan. 28  Report to [facility] Entrance A at 5:30 A.M.  Call this number if you have problems the morning of surgery:  [phone]   Remember:  Do not eat or drink after midnight.      Take these medicines the morning of surgery with A SIP OF WATER :              Citalopram (celexa)             diltiazem (cartia XT)             Tramadol if needed.               Lovastatin (mevacor)          7 days prior to surgery STOP taking any Aspirin (unless otherwise instructed by your surgeon), Aleve, Naproxen, Ibuprofen, Motrin, Advil, Goody's, BC's, all herbal medications, fish oil, and all vitamins.            Follow your surgeon's instructions on when to stop Aspirin.  If no instructions were given by your surgeon then you will need to call the office to get those instructions.                       How to Manage Your Diabetes Before and After Surgery  Why is it important to control my blood sugar before and after surgery? . Improving blood sugar levels before and after surgery helps healing and can limit problems. . A way of improving blood sugar control is eating a healthy diet by: o  Eating less sugar and carbohydrates o  Increasing activity/exercise o  Talking with your doctor about reaching your blood sugar goals . High blood sugars (greater than 180 mg/dL) can raise your risk of infections and slow your recovery, so you will need to focus on controlling your diabetes during the weeks before surgery. . Make sure that the doctor who takes care of your diabetes knows about your planned surgery including the date and location.  How do I manage my blood sugar before surgery? . Check your blood sugar at least 4 times a day, starting 2 days before surgery, to make sure that the  level is not too high or low. o Check your blood sugar the morning of your surgery when you wake up and every 2 hours until you get to the Short Stay unit. . If your blood sugar is less than 70 mg/dL, you will need to treat for low blood sugar: o Do not take insulin. o Treat a low blood sugar (less than 70 mg/dL) with  cup of clear juice (cranberry or apple), 4 glucose tablets, OR glucose gel. Recheck blood sugar in 15 minutes after treatment (to make sure it is greater than 70 mg/dL). If your blood sugar is not greater than 70 mg/dL on recheck, call [phone] o  for further instructions. . Report your blood sugar to the short stay nurse when you get to Short Stay.  . If you are admitted to the hospital after surgery: o Your blood sugar will be checked by the staff and you will probably be given insulin after surgery (instead of oral diabetes medicines) to make sure you have good blood  sugar levels. o The goal for blood sugar control after surgery is 80-180 mg/dL.        WHAT DO I DO ABOUT MY DIABETES MEDICATION?   Marland Kitchen Do not take oral diabetes medicines (pills) the morning of surgery.  . THE NIGHT BEFORE SURGERY, take ___________ units of ___________insulin.       . THE MORNING OF SURGERY, take _____________ units of __________insulin.  . The day of surgery, do not take other diabetes injectables, including Byetta (exenatide), Bydureon (exenatide ER), Victoza (liraglutide), or Trulicity (dulaglutide).  . If your CBG is greater than 220 mg/dL, you may take  of your sliding scale (correction) dose of insulin.  Other Instructions:          Patient Signature:  Date:   Nurse Signature:  Date:       Do not wear jewelry.  Do not wear lotions, powders, or perfumes, or deodorant.  Do not shave 48 hours prior to surgery.  Men may shave face and neck.  Do not bring valuables to the hospital.  [facility] is not responsible for any belongings or valuables.  Contacts,  dentures or bridgework may not be worn into surgery.  Leave your suitcase in the car.  After surgery it may be brought to your room.  For patients admitted to the hospital, discharge time will be determined by your treatment team.  Patients discharged the day of surgery will not be allowed to drive home.    Special instructions:  [facility]- Preparing For Surgery  Before surgery, you can play an important role. Because skin is not sterile, your skin needs to be as free of germs as possible. You can reduce the number of germs on your skin by washing with CHG (chlorahexidine gluconate) Soap before surgery.  CHG is an antiseptic cleaner which kills germs and bonds with the skin to continue killing germs even after washing.    Oral Hygiene is also important to reduce your risk of infection.  Remember - BRUSH YOUR TEETH THE MORNING OF SURGERY WITH YOUR REGULAR TOOTHPASTE  Please do not use if you have an allergy to CHG or antibacterial soaps. If your skin becomes reddened/irritated stop using the CHG.  Do not shave (including legs and underarms) for at least 48 hours prior to first CHG shower. It is OK to shave your face.  Please follow these instructions carefully.   1. Shower the NIGHT BEFORE SURGERY and the MORNING OF SURGERY with CHG.   2. If you chose to wash your hair, wash your hair first as usual with your normal shampoo.  3. After you shampoo, rinse your hair and body thoroughly to remove the shampoo.  4. Use CHG as you would any other liquid soap. You can apply CHG directly to the skin and wash gently with a scrungie or a clean washcloth.   5. Apply the CHG Soap to your body ONLY FROM THE NECK DOWN.  Do not use on open wounds or open sores. Avoid contact with your eyes, ears, mouth and genitals (private parts). Wash Face and genitals (private parts)  with your normal soap.  6. Wash thoroughly, paying special attention to the area where your surgery will be  performed.  7. Thoroughly rinse your body with warm water from the neck down.  8. DO NOT shower/wash with your normal soap after using and rinsing off the CHG Soap.  9. Pat yourself dry with a CLEAN TOWEL.  10. Wear CLEAN PAJAMAS to bed the  night before surgery, wear comfortable clothes the morning of surgery  11. Place CLEAN SHEETS on your bed the night of your first shower and DO NOT SLEEP WITH PETS.    Day of Surgery:  Do not apply any deodorants/lotions.  Please wear clean clothes to the hospital/surgery center.   Remember to brush your teeth WITH YOUR REGULAR TOOTHPASTE.    Please read over the following fact sheets that you were given. Coughing and Deep Breathing and Surgical Site Infection Prevention

## 2019-05-18 ENCOUNTER — Encounter (HOSPITAL_COMMUNITY): Payer: Self-pay

## 2019-05-18 ENCOUNTER — Other Ambulatory Visit: Payer: Self-pay

## 2019-05-18 ENCOUNTER — Other Ambulatory Visit (HOSPITAL_COMMUNITY)
Admission: RE | Admit: 2019-05-18 | Discharge: 2019-05-18 | Disposition: A | Discharge status: Home / Self Care | Payer: Medicare Other | Source: Ambulatory Visit | Attending: Neurosurgery | Admitting: Neurosurgery

## 2019-05-18 ENCOUNTER — Encounter (HOSPITAL_COMMUNITY)
Admission: RE | Admit: 2019-05-18 | Discharge: 2019-05-18 | Disposition: A | Discharge status: Home / Self Care | Payer: Medicare Other | Source: Ambulatory Visit | Attending: Neurosurgery | Admitting: Neurosurgery

## 2019-05-18 DIAGNOSIS — Z7982 Long term (current) use of aspirin: Secondary | ICD-10-CM | POA: Diagnosis not present

## 2019-05-18 DIAGNOSIS — Z79899 Other long term (current) drug therapy: Secondary | ICD-10-CM | POA: Diagnosis not present

## 2019-05-18 DIAGNOSIS — M48062 Spinal stenosis, lumbar region with neurogenic claudication: Secondary | ICD-10-CM | POA: Diagnosis not present

## 2019-05-18 DIAGNOSIS — E119 Type 2 diabetes mellitus without complications: Secondary | ICD-10-CM | POA: Diagnosis not present

## 2019-05-18 DIAGNOSIS — Z20822 Contact with and (suspected) exposure to covid-19: Secondary | ICD-10-CM | POA: Diagnosis not present

## 2019-05-18 DIAGNOSIS — I10 Essential (primary) hypertension: Secondary | ICD-10-CM | POA: Diagnosis not present

## 2019-05-18 DIAGNOSIS — M4316 Spondylolisthesis, lumbar region: Secondary | ICD-10-CM | POA: Diagnosis not present

## 2019-05-18 DIAGNOSIS — M1990 Unspecified osteoarthritis, unspecified site: Secondary | ICD-10-CM | POA: Diagnosis not present

## 2019-05-18 DIAGNOSIS — M5416 Radiculopathy, lumbar region: Secondary | ICD-10-CM | POA: Diagnosis not present

## 2019-05-18 HISTORY — DX: Essential (primary) hypertension: I10

## 2019-05-18 HISTORY — DX: Anxiety disorder, unspecified: F41.9

## 2019-05-18 HISTORY — DX: Unspecified osteoarthritis, unspecified site: M19.90

## 2019-05-18 LAB — CBC
HCT: 45.9 % (ref 39.0–52.0)
Hemoglobin: 15.3 g/dL (ref 13.0–17.0)
MCH: 31.4 pg (ref 26.0–34.0)
MCHC: 33.3 g/dL (ref 30.0–36.0)
MCV: 94.1 fL (ref 80.0–100.0)
Platelets: 199 10*3/uL (ref 150–400)
RBC: 4.88 MIL/uL (ref 4.22–5.81)
RDW: 13.6 % (ref 11.5–15.5)
WBC: 6.4 10*3/uL (ref 4.0–10.5)
nRBC: 0 % (ref 0.0–0.2)

## 2019-05-18 LAB — BASIC METABOLIC PANEL
Anion gap: 7 (ref 5–15)
BUN: 12 mg/dL (ref 8–23)
CO2: 27 mmol/L (ref 22–32)
Calcium: 9.1 mg/dL (ref 8.9–10.3)
Chloride: 106 mmol/L (ref 98–111)
Creatinine, Ser: 0.88 mg/dL (ref 0.61–1.24)
GFR calc Af Amer: >60 mL/min (ref >60)
GFR calc non Af Amer: >60 mL/min (ref >60)
Glucose, Bld: 126 mg/dL — ABNORMAL HIGH (ref 70–99)
Potassium: 5 mmol/L (ref 3.5–5.1)
Sodium: 140 mmol/L (ref 135–145)

## 2019-05-18 LAB — SURGICAL PCR SCREEN
MRSA, PCR: NEGATIVE
Staphylococcus aureus: NEGATIVE

## 2019-05-18 LAB — ABO/RH: ABO/RH(D): A POS

## 2019-05-18 LAB — GLUCOSE, CAPILLARY: Glucose-Capillary: 107 mg/dL — ABNORMAL HIGH (ref 70–99)

## 2019-05-18 LAB — TYPE AND SCREEN
ABO/RH(D): A POS
Antibody Screen: NEGATIVE

## 2019-05-18 LAB — SARS CORONAVIRUS 2 (TAT 6-24 HRS): SARS Coronavirus 2: NEGATIVE

## 2019-05-18 LAB — HEMOGLOBIN A1C
Hgb A1c MFr Bld: 6.2 % — ABNORMAL HIGH (ref 4.8–5.6)
Mean Plasma Glucose: 131.24 mg/dL

## 2019-05-18 NOTE — Progress Notes (Signed)
PCP - Arsenio Katz, NP Cardiologist - denies  PPM/ICD - denies  Chest x-ray - N/A EKG - 05/18/2019 Stress Test - denies ECHO - denies Cardiac Cath - denies  Sleep Study - denies CPAP - N/A  DM: Per patient does not check CBG regularly. Wife checks it for him about once every 3 months, not on any medications for it.   Blood Thinner Instructions: N/A Aspirin Instructions: LD 05/15/2019  ERAS Protcol - No PRE-SURGERY Ensure or G2- N/A  COVID TEST- Scheduled today after PAT appointment. Patient verbalized understanding of self-quarantine instructions, appointment place and time.  Anesthesia review: YES, abnormal EKG, records requested from PCP  Patient denies shortness of breath, fever, cough and chest pain at PAT appointment  All instructions explained to the patient, with a verbal understanding of the material. Patient agrees to go over the instructions while at home for a better understanding. Patient also instructed to self quarantine after being tested for COVID-19. The opportunity to ask questions was provided.

## 2019-05-18 NOTE — Pre-Procedure Instructions (Signed)
Nathan Turner  05/18/2019     [facility] - [place], [place] [address] Phone: [phone] Fax: [phone]   Your procedure is scheduled on Jan. 28th  Report to [facility] Entrance A at 5:30 A.M.  Call this number if you have problems the morning of surgery:  [phone]   Remember:  Do not eat or drink after midnight the night before your surgery (Wednesday, 05/19/2019)     Take these medicines the morning of surgery with A SIP OF WATER :              Citalopram (celexa)             diltiazem (cartia XT)             Tramadol if needed.               Lovastatin (mevacor)            Follow your surgeon's instructions on when to stop Aspirin.  If no instructions were given by your surgeon then you will need to call the office to get those instructions.           As of today, STOP taking any Aspirin (unless otherwise instructed by your surgeon), Aleve, Naproxen, Ibuprofen, Motrin, Advil, Goody's, BC's, all herbal medications, fish oil, and all vitamins.                   WHAT DO I DO ABOUT MY DIABETES MEDICATION?  Marland Kitchen Do not take oral diabetes medicines (pills) the morning of surgery.  HOW TO MANAGE YOUR DIABETES BEFORE AND AFTER SURGERY  Why is it important to control my blood sugar before and after surgery? . Improving blood sugar levels before and after surgery helps healing and can limit problems. . A way of improving blood sugar control is eating a healthy diet by: o  Eating less sugar and carbohydrates o  Increasing activity/exercise o  Talking with your doctor about reaching your blood sugar goals . High blood sugars (greater than 180 mg/dL) can raise your risk of infections and slow your recovery, so you will need to focus on controlling your diabetes during the weeks before surgery. . Make sure that the doctor who takes care of your diabetes knows about your planned surgery including the date and location.  How  do I manage my blood sugar before surgery? . Check your blood sugar at least 4 times a day, starting 2 days before surgery, to make sure that the level is not too high or low. . Check your blood sugar the morning of your surgery when you wake up and every 2 hours until you get to the Short Stay unit. o If your blood sugar is less than 70 mg/dL, you will need to treat for low blood sugar: - Do not take insulin. - Treat a low blood sugar (less than 70 mg/dL) with  cup of clear juice (cranberry or apple), 4 glucose tablets, OR glucose gel. - Recheck blood sugar in 15 minutes after treatment (to make sure it is greater than 70 mg/dL). If your blood sugar is not greater than 70 mg/dL on recheck, call [phone] for further instructions. . Report your blood sugar to the short stay nurse when you get to Short Stay.  . If you are admitted to the hospital after surgery: o Your blood sugar will be checked by the staff and you will  probably be given insulin after surgery (instead of oral diabetes medicines) to make sure you have good blood sugar levels. o The goal for blood sugar control after surgery is 80-180 mg/dL.   Do not wear jewelry.  Do not wear lotions, powders, colognes, or deodorant.  Men may shave face and neck.  Do not bring valuables to the hospital.  [facility] is not responsible for any belongings or valuables.  Contacts, dentures or bridgework may not be worn into surgery.  Leave your suitcase in the car.  After surgery it may be brought to your room.  For patients admitted to the hospital, discharge time will be determined by your treatment team.  Patients discharged the day of surgery will not be allowed to drive home.   Special instructions:  [facility]- Preparing For Surgery  Before surgery, you can play an important role. Because skin is not sterile, your skin needs to be as free of germs as possible. You can reduce the number of germs on your skin by washing with CHG  (chlorahexidine gluconate) Soap before surgery.  CHG is an antiseptic cleaner which kills germs and bonds with the skin to continue killing germs even after washing.    Oral Hygiene is also important to reduce your risk of infection.  Remember - BRUSH YOUR TEETH THE MORNING OF SURGERY WITH YOUR REGULAR TOOTHPASTE  Please do not use if you have an allergy to CHG or antibacterial soaps. If your skin becomes reddened/irritated stop using the CHG.  Do not shave (including legs and underarms) for at least 48 hours prior to first CHG shower. It is OK to shave your face.  Please follow these instructions carefully.   1. Shower the NIGHT BEFORE SURGERY and the MORNING OF SURGERY with CHG.   2. If you chose to wash your hair, wash your hair first as usual with your normal shampoo.  3. After you shampoo, rinse your hair and body thoroughly to remove the shampoo.  4. Use CHG as you would any other liquid soap. You can apply CHG directly to the skin and wash gently with a scrungie or a clean washcloth.   5. Apply the CHG Soap to your body ONLY FROM THE NECK DOWN.  Do not use on open wounds or open sores. Avoid contact with your eyes, ears, mouth and genitals (private parts). Wash Face and genitals (private parts)  with your normal soap.  6. Wash thoroughly, paying special attention to the area where your surgery will be performed.  7. Thoroughly rinse your body with warm water from the neck down.  8. DO NOT shower/wash with your normal soap after using and rinsing off the CHG Soap.  9. Pat yourself dry with a CLEAN TOWEL.  10. Wear CLEAN PAJAMAS to bed the night before surgery, wear comfortable clothes the morning of surgery  11. Place CLEAN SHEETS on your bed the night of your first shower and DO NOT SLEEP WITH PETS.  Day of Surgery: Do not apply any deodorants/lotions.  Please wear clean clothes to the hospital/surgery center.   Remember to brush your teeth WITH YOUR REGULAR  TOOTHPASTE.  Please read over the following fact sheets that you were given. Coughing and Deep Breathing and Surgical Site Infection Prevention

## 2019-05-19 NOTE — Anesthesia Preprocedure Evaluation (Addendum)
Anesthesia Evaluation  Patient identified by MRN, date of birth, ID band Patient awake    Reviewed: Allergy & Precautions, NPO status , Patient's Chart, lab work & pertinent test results  Airway Mallampati: II  TM Distance: >3 FB Neck ROM: Full    Dental  (+) Dental Advisory Given, Edentulous Upper, Edentulous Lower   Pulmonary Current Smoker,    Pulmonary exam normal breath sounds clear to auscultation       Cardiovascular hypertension, Pt. on medications Normal cardiovascular exam Rhythm:Regular Rate:Normal     Neuro/Psych PSYCHIATRIC DISORDERS Anxiety negative neurological ROS     GI/Hepatic negative GI ROS, Neg liver ROS,   Endo/Other  diabetes, Type 2  Renal/GU negative Renal ROS     Musculoskeletal  (+) Arthritis ,   Abdominal   Peds  Hematology negative hematology ROS (+)   Anesthesia Other Findings   Reproductive/Obstetrics                           Anesthesia Physical Anesthesia Plan  ASA: III  Anesthesia Plan: General   Post-op Pain Management:    Induction: Intravenous  PONV Risk Score and Plan: 2 and Ondansetron, Dexamethasone and Treatment may vary due to age or medical condition  Airway Management Planned: Oral ETT  Additional Equipment:   Intra-op Plan:   Post-operative Plan: Extubation in OR  Informed Consent: I have reviewed the patients History and Physical, chart, labs and discussed the procedure including the risks, benefits and alternatives for the proposed anesthesia with the patient or authorized representative who has indicated his/her understanding and acceptance.     Dental advisory given  Plan Discussed with: CRNA  Anesthesia Plan Comments: (Abnormal preop EKG shows Normal sinus rhythm, rate 61, RBBB. No comparison tracing on file at PCP. Pt has no cardiac history, his DMII is well controlled with A1c 6.2 on 05/18/19. Remainder of preop labs WNL.  PCP note says history of normal echo, LVH, EF 60% - there is no date on the study.)      Anesthesia Quick Evaluation

## 2019-05-20 ENCOUNTER — Inpatient Hospital Stay (HOSPITAL_COMMUNITY): Payer: Medicare Other | Admitting: Anesthesiology

## 2019-05-20 ENCOUNTER — Encounter (HOSPITAL_COMMUNITY): Payer: Self-pay | Admitting: Neurosurgery

## 2019-05-20 ENCOUNTER — Inpatient Hospital Stay (HOSPITAL_COMMUNITY): Payer: Medicare Other

## 2019-05-20 ENCOUNTER — Encounter (HOSPITAL_COMMUNITY)
Admission: RE | Disposition: A | Discharge status: Home / Self Care | Payer: Self-pay | Source: Home / Self Care | Attending: Neurosurgery

## 2019-05-20 ENCOUNTER — Inpatient Hospital Stay (HOSPITAL_COMMUNITY)
Admission: RE | Admit: 2019-05-20 | Discharge: 2019-05-21 | DRG: 455 | Disposition: A | Discharge status: Home Health | Payer: Medicare Other | Attending: Neurosurgery | Admitting: Neurosurgery

## 2019-05-20 ENCOUNTER — Inpatient Hospital Stay (HOSPITAL_COMMUNITY): Payer: Medicare Other | Admitting: Vascular Surgery

## 2019-05-20 ENCOUNTER — Other Ambulatory Visit: Payer: Self-pay

## 2019-05-20 DIAGNOSIS — Z419 Encounter for procedure for purposes other than remedying health state, unspecified: Secondary | ICD-10-CM

## 2019-05-20 DIAGNOSIS — M4316 Spondylolisthesis, lumbar region: Secondary | ICD-10-CM | POA: Diagnosis not present

## 2019-05-20 DIAGNOSIS — Z79899 Other long term (current) drug therapy: Secondary | ICD-10-CM

## 2019-05-20 DIAGNOSIS — Z7982 Long term (current) use of aspirin: Secondary | ICD-10-CM

## 2019-05-20 DIAGNOSIS — Z20822 Contact with and (suspected) exposure to covid-19: Secondary | ICD-10-CM | POA: Diagnosis present

## 2019-05-20 DIAGNOSIS — F17200 Nicotine dependence, unspecified, uncomplicated: Secondary | ICD-10-CM | POA: Diagnosis present

## 2019-05-20 DIAGNOSIS — Z981 Arthrodesis status: Secondary | ICD-10-CM | POA: Diagnosis not present

## 2019-05-20 DIAGNOSIS — M5416 Radiculopathy, lumbar region: Secondary | ICD-10-CM | POA: Diagnosis not present

## 2019-05-20 DIAGNOSIS — F419 Anxiety disorder, unspecified: Secondary | ICD-10-CM | POA: Diagnosis present

## 2019-05-20 DIAGNOSIS — I10 Essential (primary) hypertension: Secondary | ICD-10-CM | POA: Diagnosis present

## 2019-05-20 DIAGNOSIS — M1990 Unspecified osteoarthritis, unspecified site: Secondary | ICD-10-CM | POA: Diagnosis present

## 2019-05-20 DIAGNOSIS — E119 Type 2 diabetes mellitus without complications: Secondary | ICD-10-CM | POA: Diagnosis present

## 2019-05-20 DIAGNOSIS — M48062 Spinal stenosis, lumbar region with neurogenic claudication: Secondary | ICD-10-CM | POA: Diagnosis not present

## 2019-05-20 DIAGNOSIS — M4326 Fusion of spine, lumbar region: Secondary | ICD-10-CM | POA: Diagnosis not present

## 2019-05-20 LAB — GLUCOSE, CAPILLARY
Glucose-Capillary: 136 mg/dL — ABNORMAL HIGH (ref 70–99)
Glucose-Capillary: 126 mg/dL — ABNORMAL HIGH (ref 70–99)
Glucose-Capillary: 201 mg/dL — ABNORMAL HIGH (ref 70–99)
Glucose-Capillary: 131 mg/dL — ABNORMAL HIGH (ref 70–99)

## 2019-05-20 SURGERY — POSTERIOR LUMBAR FUSION 1 LEVEL
Anesthesia: General | Site: Spine Lumbar

## 2019-05-20 MED ORDER — ACETAMINOPHEN 325 MG PO TABS
650.0000 mg | ORAL_TABLET | ORAL | Status: DC | PRN
  Filled 2019-05-20: qty 2

## 2019-05-20 MED ORDER — FENTANYL CITRATE (PF) 100 MCG/2ML IJ SOLN
INTRAMUSCULAR | Status: DC | PRN
  Administered 2019-05-20 (×4): 50 ug via INTRAVENOUS
  Administered 2019-05-20: 100 ug via INTRAVENOUS
  Administered 2019-05-20 (×3): 50 ug via INTRAVENOUS

## 2019-05-20 MED ORDER — SUGAMMADEX SODIUM 200 MG/2ML IV SOLN
INTRAVENOUS | Status: DC | PRN
  Administered 2019-05-20: 200 mg via INTRAVENOUS

## 2019-05-20 MED ORDER — BACITRACIN ZINC 500 UNIT/GM EX OINT
TOPICAL_OINTMENT | CUTANEOUS | Status: AC
  Filled 2019-05-20: qty 28.35

## 2019-05-20 MED ORDER — LIDOCAINE 2% (20 MG/ML) 5 ML SYRINGE
INTRAMUSCULAR | Status: AC
  Filled 2019-05-20: qty 10

## 2019-05-20 MED ORDER — THROMBIN 5000 UNITS EX SOLR
CUTANEOUS | Status: AC
  Filled 2019-05-20: qty 5000

## 2019-05-20 MED ORDER — ONDANSETRON HCL 4 MG/2ML IJ SOLN
INTRAMUSCULAR | Status: AC
  Filled 2019-05-20: qty 2

## 2019-05-20 MED ORDER — MENTHOL 3 MG MT LOZG: 1.0000 | LOZENGE | OROMUCOSAL | Status: DC | PRN

## 2019-05-20 MED ORDER — PHENYLEPHRINE HCL-NACL 10-0.9 MG/250ML-% IV SOLN
INTRAVENOUS | Status: DC | PRN
  Administered 2019-05-20: 20 ug/min via INTRAVENOUS

## 2019-05-20 MED ORDER — TRAMADOL HCL 50 MG PO TABS: 50.0000 mg | ORAL_TABLET | Freq: Four times a day (QID) | ORAL | Status: DC | PRN

## 2019-05-20 MED ORDER — BUPIVACAINE-EPINEPHRINE (PF) 0.25% -1:200000 IJ SOLN
INTRAMUSCULAR | Status: AC
  Filled 2019-05-20: qty 10

## 2019-05-20 MED ORDER — MEPERIDINE HCL 25 MG/ML IJ SOLN: 6.2500 mg | INTRAMUSCULAR | Status: DC | PRN

## 2019-05-20 MED ORDER — ROCURONIUM BROMIDE 10 MG/ML (PF) SYRINGE
PREFILLED_SYRINGE | INTRAVENOUS | Status: AC
  Filled 2019-05-20: qty 20

## 2019-05-20 MED ORDER — ONDANSETRON HCL 4 MG/2ML IJ SOLN: 4.0000 mg | Freq: Four times a day (QID) | INTRAMUSCULAR | Status: DC | PRN

## 2019-05-20 MED ORDER — CEFAZOLIN SODIUM-DEXTROSE 2-4 GM/100ML-% IV SOLN
2.0000 g | INTRAVENOUS | Status: AC
  Administered 2019-05-20: 08:00:00 2 g via INTRAVENOUS
  Filled 2019-05-20: qty 100

## 2019-05-20 MED ORDER — SODIUM CHLORIDE 0.9 % IV SOLN: 250.0000 mL | INTRAVENOUS | Status: DC

## 2019-05-20 MED ORDER — ACETAMINOPHEN 650 MG RE SUPP: 650.0000 mg | RECTAL | Status: DC | PRN

## 2019-05-20 MED ORDER — MORPHINE SULFATE (PF) 4 MG/ML IV SOLN: 4.0000 mg | INTRAVENOUS | Status: DC | PRN

## 2019-05-20 MED ORDER — OXYCODONE HCL 5 MG PO TABS
5.0000 mg | ORAL_TABLET | ORAL | Status: DC | PRN
  Administered 2019-05-20: 5 mg via ORAL
  Filled 2019-05-20: qty 1

## 2019-05-20 MED ORDER — CITALOPRAM HYDROBROMIDE 20 MG PO TABS
20.0000 mg | ORAL_TABLET | Freq: Every day | ORAL | Status: DC
  Administered 2019-05-20 – 2019-05-21 (×2): 20 mg via ORAL
  Filled 2019-05-20 (×2): qty 1

## 2019-05-20 MED ORDER — THROMBIN 5000 UNITS EX SOLR
OROMUCOSAL | Status: DC | PRN
  Administered 2019-05-20: 5 mL via TOPICAL

## 2019-05-20 MED ORDER — ROCURONIUM BROMIDE 10 MG/ML (PF) SYRINGE
PREFILLED_SYRINGE | INTRAVENOUS | Status: DC | PRN
  Administered 2019-05-20 (×2): 20 mg via INTRAVENOUS
  Administered 2019-05-20: 50 mg via INTRAVENOUS
  Administered 2019-05-20 (×2): 20 mg via INTRAVENOUS

## 2019-05-20 MED ORDER — MIDAZOLAM HCL 2 MG/2ML IJ SOLN
INTRAMUSCULAR | Status: AC
  Filled 2019-05-20: qty 2

## 2019-05-20 MED ORDER — ONDANSETRON HCL 4 MG/2ML IJ SOLN
INTRAMUSCULAR | Status: DC | PRN
  Administered 2019-05-20: 4 mg via INTRAVENOUS

## 2019-05-20 MED ORDER — SODIUM CHLORIDE 0.9 % IV SOLN
INTRAVENOUS | Status: DC | PRN
  Administered 2019-05-20: 500 mL

## 2019-05-20 MED ORDER — CHLORHEXIDINE GLUCONATE CLOTH 2 % EX PADS: 6.0000 | MEDICATED_PAD | Freq: Once | CUTANEOUS | Status: DC

## 2019-05-20 MED ORDER — ROCURONIUM BROMIDE 10 MG/ML (PF) SYRINGE
PREFILLED_SYRINGE | INTRAVENOUS | Status: AC
  Filled 2019-05-20: qty 10

## 2019-05-20 MED ORDER — TEMAZEPAM 15 MG PO CAPS
15.0000 mg | ORAL_CAPSULE | Freq: Every day | ORAL | Status: DC
  Administered 2019-05-20: 15 mg via ORAL
  Filled 2019-05-20: qty 1

## 2019-05-20 MED ORDER — PHENYLEPHRINE 40 MCG/ML (10ML) SYRINGE FOR IV PUSH (FOR BLOOD PRESSURE SUPPORT)
PREFILLED_SYRINGE | INTRAVENOUS | Status: DC | PRN
  Administered 2019-05-20 (×3): 120 ug via INTRAVENOUS
  Administered 2019-05-20: 40 ug via INTRAVENOUS

## 2019-05-20 MED ORDER — BUPIVACAINE LIPOSOME 1.3 % IJ SUSP
INTRAMUSCULAR | Status: DC | PRN
  Administered 2019-05-20: 20 mL

## 2019-05-20 MED ORDER — FENTANYL CITRATE (PF) 250 MCG/5ML IJ SOLN
INTRAMUSCULAR | Status: AC
  Filled 2019-05-20: qty 5

## 2019-05-20 MED ORDER — ONDANSETRON HCL 4 MG/2ML IJ SOLN: 4.0000 mg | Freq: Once | INTRAMUSCULAR | Status: DC | PRN

## 2019-05-20 MED ORDER — INSULIN ASPART 100 UNIT/ML ~~LOC~~ SOLN
0.0000 [IU] | Freq: Three times a day (TID) | SUBCUTANEOUS | Status: DC
  Administered 2019-05-21: 2 [IU] via SUBCUTANEOUS

## 2019-05-20 MED ORDER — DEXAMETHASONE SODIUM PHOSPHATE 10 MG/ML IJ SOLN
INTRAMUSCULAR | Status: AC
  Filled 2019-05-20: qty 1

## 2019-05-20 MED ORDER — ACETAMINOPHEN 500 MG PO TABS
1000.0000 mg | ORAL_TABLET | Freq: Four times a day (QID) | ORAL | Status: AC
  Administered 2019-05-20 – 2019-05-21 (×4): 1000 mg via ORAL
  Filled 2019-05-20 (×4): qty 2

## 2019-05-20 MED ORDER — ONDANSETRON HCL 4 MG PO TABS: 4.0000 mg | ORAL_TABLET | Freq: Four times a day (QID) | ORAL | Status: DC | PRN

## 2019-05-20 MED ORDER — LIDOCAINE 2% (20 MG/ML) 5 ML SYRINGE
INTRAMUSCULAR | Status: DC | PRN
  Administered 2019-05-20: 100 mg via INTRAVENOUS

## 2019-05-20 MED ORDER — SODIUM CHLORIDE 0.9% FLUSH: 3.0000 mL | INTRAVENOUS | Status: DC | PRN

## 2019-05-20 MED ORDER — SODIUM CHLORIDE 0.9% FLUSH
3.0000 mL | Freq: Two times a day (BID) | INTRAVENOUS | Status: DC
  Administered 2019-05-20: 3 mL via INTRAVENOUS

## 2019-05-20 MED ORDER — BUPIVACAINE-EPINEPHRINE (PF) 0.25% -1:200000 IJ SOLN
INTRAMUSCULAR | Status: DC | PRN
  Administered 2019-05-20: 10 mL

## 2019-05-20 MED ORDER — BUPIVACAINE LIPOSOME 1.3 % IJ SUSP
20.0000 mL | Freq: Once | INTRAMUSCULAR | Status: DC
  Filled 2019-05-20: qty 20

## 2019-05-20 MED ORDER — DEXAMETHASONE SODIUM PHOSPHATE 10 MG/ML IJ SOLN
INTRAMUSCULAR | Status: DC | PRN
  Administered 2019-05-20: 4 mg via INTRAVENOUS

## 2019-05-20 MED ORDER — HYDROMORPHONE HCL 1 MG/ML IJ SOLN
0.2500 mg | INTRAMUSCULAR | Status: DC | PRN
  Administered 2019-05-20: 0.25 mg via INTRAVENOUS

## 2019-05-20 MED ORDER — BISACODYL 10 MG RE SUPP: 10.0000 mg | Freq: Every day | RECTAL | Status: DC | PRN

## 2019-05-20 MED ORDER — ACETAMINOPHEN 10 MG/ML IV SOLN
1000.0000 mg | Freq: Once | INTRAVENOUS | Status: AC
  Administered 2019-05-20: 1000 mg via INTRAVENOUS

## 2019-05-20 MED ORDER — OXYCODONE HCL 5 MG PO TABS
10.0000 mg | ORAL_TABLET | ORAL | Status: DC | PRN
  Administered 2019-05-20 – 2019-05-21 (×6): 10 mg via ORAL
  Filled 2019-05-20 (×6): qty 2

## 2019-05-20 MED ORDER — ROPINIROLE HCL 1 MG PO TABS
1.0000 mg | ORAL_TABLET | Freq: Every day | ORAL | Status: DC
  Administered 2019-05-20: 1 mg via ORAL
  Filled 2019-05-20: qty 1

## 2019-05-20 MED ORDER — HYDROMORPHONE HCL 1 MG/ML IJ SOLN
INTRAMUSCULAR | Status: AC
  Filled 2019-05-20: qty 1

## 2019-05-20 MED ORDER — DILTIAZEM HCL ER COATED BEADS 120 MG PO CP24
120.0000 mg | ORAL_CAPSULE | Freq: Every day | ORAL | Status: DC
  Administered 2019-05-20 – 2019-05-21 (×2): 120 mg via ORAL
  Filled 2019-05-20 (×2): qty 1

## 2019-05-20 MED ORDER — PRAVASTATIN SODIUM 10 MG PO TABS
20.0000 mg | ORAL_TABLET | Freq: Every day | ORAL | Status: DC
  Administered 2019-05-20: 20 mg via ORAL
  Filled 2019-05-20: qty 2

## 2019-05-20 MED ORDER — PHENOL 1.4 % MT LIQD: 1.0000 | OROMUCOSAL | Status: DC | PRN

## 2019-05-20 MED ORDER — CEFAZOLIN SODIUM-DEXTROSE 2-4 GM/100ML-% IV SOLN
2.0000 g | Freq: Three times a day (TID) | INTRAVENOUS | Status: AC
  Administered 2019-05-20 (×2): 2 g via INTRAVENOUS
  Filled 2019-05-20 (×2): qty 100

## 2019-05-20 MED ORDER — INSULIN ASPART 100 UNIT/ML ~~LOC~~ SOLN: 0.0000 [IU] | Freq: Every day | SUBCUTANEOUS | Status: DC

## 2019-05-20 MED ORDER — PROPOFOL 10 MG/ML IV BOLUS
INTRAVENOUS | Status: DC | PRN
  Administered 2019-05-20: 150 mg via INTRAVENOUS

## 2019-05-20 MED ORDER — DOCUSATE SODIUM 100 MG PO CAPS
100.0000 mg | ORAL_CAPSULE | Freq: Two times a day (BID) | ORAL | Status: DC
  Administered 2019-05-20 – 2019-05-21 (×3): 100 mg via ORAL
  Filled 2019-05-20 (×3): qty 1

## 2019-05-20 MED ORDER — CYCLOBENZAPRINE HCL 10 MG PO TABS
10.0000 mg | ORAL_TABLET | Freq: Three times a day (TID) | ORAL | Status: DC | PRN
  Administered 2019-05-20 – 2019-05-21 (×3): 10 mg via ORAL
  Filled 2019-05-20 (×3): qty 1

## 2019-05-20 MED ORDER — BACITRACIN ZINC 500 UNIT/GM EX OINT
TOPICAL_OINTMENT | CUTANEOUS | Status: DC | PRN
  Administered 2019-05-20: 1 via TOPICAL

## 2019-05-20 MED ORDER — LACTATED RINGERS IV SOLN: INTRAVENOUS | Status: DC | PRN

## 2019-05-20 MED ORDER — ACETAMINOPHEN 10 MG/ML IV SOLN
INTRAVENOUS | Status: AC
  Filled 2019-05-20: qty 100

## 2019-05-20 MED ORDER — PROPOFOL 10 MG/ML IV BOLUS
INTRAVENOUS | Status: AC
  Filled 2019-05-20: qty 40

## 2019-05-20 MED ORDER — 0.9 % SODIUM CHLORIDE (POUR BTL) OPTIME
TOPICAL | Status: DC | PRN
  Administered 2019-05-20: 1000 mL

## 2019-05-20 SURGICAL SUPPLY — 70 items
BAG DECANTER FOR FLEXI CONT (MISCELLANEOUS) ×3 IMPLANT
BENZOIN TINCTURE PRP APPL 2/3 (GAUZE/BANDAGES/DRESSINGS) ×3 IMPLANT
BLADE CLIPPER SURG (BLADE) IMPLANT
BUR MATCHSTICK NEURO 3.0 LAGG (BURR) ×3 IMPLANT
BUR PRECISION FLUTE 6.0 (BURR) ×3 IMPLANT
CAGE ALTERA 10X31X9-13 15D (Cage) ×2 IMPLANT
CAGE ALTERA 9-13-15-31MM (Cage) ×1 IMPLANT
CANISTER SUCT 3000ML PPV (MISCELLANEOUS) ×3 IMPLANT
CAP LOCK DLX THRD (Cap) ×12 IMPLANT
CARTRIDGE OIL MAESTRO DRILL (MISCELLANEOUS) ×1 IMPLANT
CLOSURE WOUND 1/2 X4 (GAUZE/BANDAGES/DRESSINGS) ×1
CONT SPEC 4OZ CLIKSEAL STRL BL (MISCELLANEOUS) ×3 IMPLANT
COVER BACK TABLE 60X90IN (DRAPES) ×3 IMPLANT
COVER WAND RF STERILE (DRAPES) IMPLANT
DECANTER SPIKE VIAL GLASS SM (MISCELLANEOUS) ×3 IMPLANT
DIFFUSER DRILL AIR PNEUMATIC (MISCELLANEOUS) ×3 IMPLANT
DRAPE C-ARM 42X72 X-RAY (DRAPES) ×6 IMPLANT
DRAPE HALF SHEET 40X57 (DRAPES) ×3 IMPLANT
DRAPE LAPAROTOMY 100X72X124 (DRAPES) ×3 IMPLANT
DRAPE SURG 17X23 STRL (DRAPES) ×12 IMPLANT
DRSG OPSITE POSTOP 4X6 (GAUZE/BANDAGES/DRESSINGS) ×3 IMPLANT
ELECT BLADE 4.0 EZ CLEAN MEGAD (MISCELLANEOUS) ×3
ELECT REM PT RETURN 9FT ADLT (ELECTROSURGICAL) ×3
ELECTRODE BLDE 4.0 EZ CLN MEGD (MISCELLANEOUS) ×1 IMPLANT
ELECTRODE REM PT RTRN 9FT ADLT (ELECTROSURGICAL) ×1 IMPLANT
EVACUATOR 1/8 PVC DRAIN (DRAIN) IMPLANT
GAUZE 4X4 16PLY RFD (DISPOSABLE) IMPLANT
GAUZE SPONGE 4X4 12PLY STRL (GAUZE/BANDAGES/DRESSINGS) IMPLANT
GLOVE BIO SURGEON STRL SZ7 (GLOVE) ×6 IMPLANT
GLOVE BIO SURGEON STRL SZ7.5 (GLOVE) ×3 IMPLANT
GLOVE BIO SURGEON STRL SZ8 (GLOVE) ×6 IMPLANT
GLOVE BIO SURGEON STRL SZ8.5 (GLOVE) ×6 IMPLANT
GLOVE BIOGEL PI IND STRL 7.0 (GLOVE) ×2 IMPLANT
GLOVE BIOGEL PI INDICATOR 7.0 (GLOVE) ×4
GLOVE ECLIPSE 6.5 STRL STRAW (GLOVE) ×3 IMPLANT
GLOVE EXAM NITRILE XL STR (GLOVE) IMPLANT
GLOVE SURG SS PI 7.0 STRL IVOR (GLOVE) ×6 IMPLANT
GLOVE SURG SS PI 7.5 STRL IVOR (GLOVE) ×18 IMPLANT
GOWN STRL REUS W/ TWL LRG LVL3 (GOWN DISPOSABLE) ×6 IMPLANT
GOWN STRL REUS W/ TWL XL LVL3 (GOWN DISPOSABLE) ×2 IMPLANT
GOWN STRL REUS W/TWL 2XL LVL3 (GOWN DISPOSABLE) IMPLANT
GOWN STRL REUS W/TWL LRG LVL3 (GOWN DISPOSABLE) ×12
GOWN STRL REUS W/TWL XL LVL3 (GOWN DISPOSABLE) ×4
HEMOSTAT POWDER KIT SURGIFOAM (HEMOSTASIS) ×3 IMPLANT
KIT BASIN OR (CUSTOM PROCEDURE TRAY) ×3 IMPLANT
KIT TURNOVER KIT B (KITS) ×3 IMPLANT
MILL MEDIUM DISP (BLADE) IMPLANT
NEEDLE HYPO 21X1.5 SAFETY (NEEDLE) ×3 IMPLANT
NEEDLE HYPO 22GX1.5 SAFETY (NEEDLE) ×3 IMPLANT
NS IRRIG 1000ML POUR BTL (IV SOLUTION) ×3 IMPLANT
OIL CARTRIDGE MAESTRO DRILL (MISCELLANEOUS) ×3
PACK LAMINECTOMY NEURO (CUSTOM PROCEDURE TRAY) ×3 IMPLANT
PAD ARMBOARD 7.5X6 YLW CONV (MISCELLANEOUS) ×15 IMPLANT
PATTIES SURGICAL .5 X1 (DISPOSABLE) IMPLANT
PUTTY DBM 10CC CALC GRAN (Putty) ×3 IMPLANT
ROD CREO DLX CVD 6.35X40 (Rod) ×2 IMPLANT
ROD CURVED TI 6.35X40 (Rod) ×4 IMPLANT
SCREW PA DLX CREO 7.5X50 (Screw) ×12 IMPLANT
SPONGE LAP 4X18 RFD (DISPOSABLE) IMPLANT
SPONGE NEURO XRAY DETECT 1X3 (DISPOSABLE) IMPLANT
SPONGE SURGIFOAM ABS GEL 100 (HEMOSTASIS) IMPLANT
STRIP CLOSURE SKIN 1/2X4 (GAUZE/BANDAGES/DRESSINGS) ×2 IMPLANT
SUT VIC AB 1 CT1 18XBRD ANBCTR (SUTURE) ×2 IMPLANT
SUT VIC AB 1 CT1 8-18 (SUTURE) ×4
SUT VIC AB 2-0 CP2 18 (SUTURE) ×6 IMPLANT
SYR 20ML LL LF (SYRINGE) ×3 IMPLANT
TOWEL GREEN STERILE (TOWEL DISPOSABLE) ×3 IMPLANT
TOWEL GREEN STERILE FF (TOWEL DISPOSABLE) ×3 IMPLANT
TRAY FOLEY MTR SLVR 16FR STAT (SET/KITS/TRAYS/PACK) ×3 IMPLANT
WATER STERILE IRR 1000ML POUR (IV SOLUTION) ×3 IMPLANT

## 2019-05-20 NOTE — H&P (Signed)
Subjective: The patient is a 74 year old white male who has complained of back and leg pain consistent with neurogenic claudication.  He has failed medical management.  He was worked up with lumbar x-rays and a lumbar MRI which demonstrated an L4-5 spondylolisthesis and spinal stenosis.  I discussed the various treatment options with him.  He has decided to proceed with surgery.  Past Medical History:  Diagnosis Date  . Anxiety   . Arthritis   . Cancer ([place])   . Diabetes mellitus without complication ([place])   . Hypertension     Past Surgical History:  Procedure Laterality Date  . TONSILLECTOMY      No Known Allergies  Social History   Tobacco Use  . Smoking status: Current Every Day Smoker    Packs/day: 1.00    Years: 64.00    Pack years: 64.00    Types: Cigarettes  . Smokeless tobacco: Never Used  Substance Use Topics  . Alcohol use: Never    History reviewed. No pertinent family history. Prior to Admission medications   Medication Sig Start Date End Date Taking? Authorizing Provider  aspirin EC 81 MG tablet Take 81 mg by mouth every other day.    Yes [provider]  citalopram (CELEXA) 20 MG tablet Take 20 mg by mouth daily.   Yes [provider]  diltiazem (CARTIA XT) 120 MG 24 hr capsule Take 120 mg by mouth daily.   Yes [provider]  lovastatin (MEVACOR) 20 MG tablet Take 20 mg by mouth daily.    Yes [provider]  Omega-3 Fatty Acids (FISH OIL) 1200 MG CAPS Take 1,200 mg by mouth daily.   Yes [provider]  rOPINIRole (REQUIP) 1 MG tablet Take 1 mg by mouth at bedtime.   Yes [provider]  temazepam (RESTORIL) 15 MG capsule Take 15 mg by mouth at bedtime.    Yes [provider]  traMADol (ULTRAM) 50 MG tablet Take 50 mg by mouth every 6 (six) hours as needed (for pain.).    Yes [provider]     Review of Systems  Positive ROS: As above  All other systems have been reviewed and were  otherwise negative with the exception of those mentioned in the HPI and as above.  Objective: Vital signs in last 24 hours: Temp:  [98.1 F (36.7 C)] 98.1 F (36.7 C) (01/28 0626) Pulse Rate:  [60] 60 (01/28 0626) Resp:  [20] 20 (01/28 0626) BP: (151)/(73) 151/73 (01/28 0626) SpO2:  [95 %] 95 % (01/28 0626) Estimated body mass index is 24.17 kg/m as calculated from the following:   Height as of 05/18/19: 5' 8.5" (1.74 m).   Weight as of 05/18/19: 73.2 kg.   General Appearance: Alert Head: Normocephalic, without obvious abnormality, atraumatic Eyes: PERRL, conjunctiva/corneas clear, EOM's intact,    Ears: Normal  Throat: Normal  Neck: Supple, Back: unremarkable Lungs: Clear to auscultation bilaterally, respirations unlabored Heart: Regular rate and rhythm, no murmur, rub or gallop Abdomen: Soft, non-tender Extremities: Extremities normal, atraumatic, no cyanosis or edema Skin: unremarkable  NEUROLOGIC:   Mental status: alert and oriented,Motor Exam - grossly normal Sensory Exam - grossly normal Reflexes:  Coordination - grossly normal Gait - grossly normal Balance - grossly normal Cranial Nerves: I: smell Not tested  II: visual acuity  OS: Normal  OD: Normal   II: visual fields Full to confrontation  II: pupils Equal, round, reactive to light  III,VII: ptosis None  III,IV,VI: extraocular muscles  Full ROM  V: mastication Normal  V: facial light touch sensation  Normal  V,VII: corneal reflex  Present  VII: facial muscle function - upper  Normal  VII: facial muscle function - lower Normal  VIII: hearing Not tested  IX: soft palate elevation  Normal  IX,X: gag reflex Present  XI: trapezius strength  5/5  XI: sternocleidomastoid strength 5/5  XI: neck flexion strength  5/5  XII: tongue strength  Normal    Data Review Lab Results  Component Value Date   WBC 6.4 05/18/2019   HGB 15.3 05/18/2019   HCT 45.9 05/18/2019   MCV 94.1 05/18/2019   PLT 199 05/18/2019    Lab Results  Component Value Date   NA 140 05/18/2019   K 5.0 05/18/2019   CL 106 05/18/2019   CO2 27 05/18/2019   BUN 12 05/18/2019   CREATININE 0.88 05/18/2019   GLUCOSE 126 (H) 05/18/2019   No results found for: INR, PROTIME  Assessment/Plan: L4-5 spondylolisthesis, severe spinal stenosis, lumbago, lumbar radiculopathy, neurogenic claudication: I have discussed the situation with the patient.  I have reviewed his imaging studies with him and pointed out the abnormalities.  We have discussed the various treatment options including surgery.  I have described the surgical treatment option of an L4-5 decompression, instrumentation and fusion.  I have shown him surgical models.  I have given him a surgical pamphlet.  We have discussed the risks, benefits, alternatives, expected postoperative course, and likelihood of achieving our goals with surgery.  I have answered all his questions.  He has decided to proceed with surgery.   Ophelia Charter 05/20/2019 7:18 AM

## 2019-05-20 NOTE — Anesthesia Postprocedure Evaluation (Signed)
Anesthesia Post Note  Patient: Nathan Turner  Procedure(s) Performed: POSTERIOR LUMBAR INTERBODY FUSION, INTERBODY PROSTHESIS, POSTERIOR INSTRUMENTATION, LUMBAR FOUR- LUMBAR FIVE (N/A Spine Lumbar)     Patient location during evaluation: PACU Anesthesia Type: General Level of consciousness: sedated and patient cooperative Pain management: pain level controlled Vital Signs Assessment: post-procedure vital signs reviewed and stable Respiratory status: spontaneous breathing Cardiovascular status: stable Anesthetic complications: no    Last Vitals:  Vitals:   05/20/19 1147 05/20/19 1207  BP: 112/76 134/88  Pulse: 78 78  Resp: 14 20  Temp:  36.5 C  SpO2: 93% 96%    Last Pain:  Vitals:   05/20/19 1330  TempSrc:   PainSc: [place]

## 2019-05-20 NOTE — Progress Notes (Signed)
Subjective: The patient is somnolent but arousable.  He is in no apparent distress.  Objective: Vital signs in last 24 hours: Temp:  [98.1 F (36.7 C)] 98.1 F (36.7 C) (01/28 0626) Pulse Rate:  [60] 60 (01/28 0626) Resp:  [20] 20 (01/28 0626) BP: (151)/(73) 151/73 (01/28 0626) SpO2:  [95 %] 95 % (01/28 0626) Estimated body mass index is 24.17 kg/m as calculated from the following:   Height as of 05/18/19: 5' 8.5" (1.74 m).   Weight as of 05/18/19: 73.2 kg.   Intake/Output from previous day: No intake/output data recorded. Intake/Output this shift: Total I/O In: 1500 [I.V.:1500] Out: 440 [Urine:240; Blood:200]  Physical exam the patient is somnolent but arousable.  He is moving all 4 extremities.  Lab Results: Recent Labs    05/18/19 1153  WBC 6.4  HGB 15.3  HCT 45.9  PLT 199   BMET Recent Labs    05/18/19 1153  NA 140  K 5.0  CL 106  CO2 27  GLUCOSE 126*  BUN 12  CREATININE 0.88  CALCIUM 9.1    Studies/Results: DG Lumbar Spine 2-3 Views  Result Date: 05/20/2019 CLINICAL DATA:  Lumbar fusion EXAM: LUMBAR SPINE - 2-3 VIEW; DG C-ARM 1-60 MIN COMPARISON:  None. FLUOROSCOPY TIME:  Fluoroscopy Time:  12 seconds Radiation Exposure Index (if provided by the fluoroscopic device): Not available Number of Acquired Spot Images: 2 FINDINGS: Changes of interbody fusion at L4-5 are noted with pedicle screw placement. Posterior fixation elements have not been placed. IMPRESSION: L4-5 fusion. Electronically Signed   By: Inez Catalina M.D.   On: 05/20/2019 10:35   DG Lumbar Spine 1 View  Result Date: 05/20/2019 CLINICAL DATA:  Localization in OR EXAM: LUMBAR SPINE - 1 VIEW COMPARISON:  Portable exam 0818 hours compared to 05/11/2019 and MRI of 05/04/2019 FINDINGS: Prior MRI labeled with 5 lumbar vertebra. Metallic probe via dorsal approach projects dorsal to the superior endplate of L5. Tissue spreader and surgical sponge seen dorsally at the L4-L5 level. IMPRESSION: Dorsal  localization of the superior endplate of L5. Electronically Signed   By: Lavonia Dana M.D.   On: 05/20/2019 11:03   DG C-Arm 1-60 Min  Result Date: 05/20/2019 CLINICAL DATA:  Lumbar fusion EXAM: LUMBAR SPINE - 2-3 VIEW; DG C-ARM 1-60 MIN COMPARISON:  None. FLUOROSCOPY TIME:  Fluoroscopy Time:  12 seconds Radiation Exposure Index (if provided by the fluoroscopic device): Not available Number of Acquired Spot Images: 2 FINDINGS: Changes of interbody fusion at L4-5 are noted with pedicle screw placement. Posterior fixation elements have not been placed. IMPRESSION: L4-5 fusion. Electronically Signed   By: Inez Catalina M.D.   On: 05/20/2019 10:35    Assessment/Plan: The patient is doing well.  I called his wife at home and got a full voicemail.  She is not in the waiting room either.  LOS: 0 days     Ophelia Charter 05/20/2019, 11:09 AM

## 2019-05-20 NOTE — Anesthesia Procedure Notes (Addendum)
Procedure Name: Intubation Date/Time: 05/20/2019 7:41 AM Performed by: Leonor Liv, CRNA Pre-anesthesia Checklist: Patient identified, Emergency Drugs available, Suction available and Patient being monitored Patient Re-evaluated:Patient Re-evaluated prior to induction Oxygen Delivery Method: Circle System Utilized Preoxygenation: Pre-oxygenation with 100% oxygen Induction Type: IV induction Ventilation: Mask ventilation without difficulty Laryngoscope Size: Mac and 4 Grade View: Grade I Tube type: Oral Tube size: 7.5 mm Number of attempts: 1 Airway Equipment and Method: Stylet and Oral airway Placement Confirmation: ETT inserted through vocal cords under direct vision,  positive ETCO2 and breath sounds checked- equal and bilateral Secured at: 23 cm Tube secured with: Tape Dental Injury: Teeth and Oropharynx as per pre-operative assessment

## 2019-05-20 NOTE — Op Note (Signed)
Brief history: The patient is a 74 year old white male who has complained of back and leg pain consistent with neurogenic claudication.  He has failed medical management and was worked up with a lumbar MRI which demonstrated an L4-5 spondylolisthesis with severe multifactorial spinal stenosis.  I discussed the various treatment options with him including surgery.  He has weighed the risks, benefits and alternatives of surgery and decided proceed with an L4-5 decompression, instrumentation and fusion.  Preoperative diagnosis: L4-5 spondylolisthesis,  spinal stenosis compressing both the L4 and the L5 nerve roots; lumbago; lumbar radiculopathy; neurogenic claudication  Postoperative diagnosis: The same  Procedure: Bilateral L4-5 laminotomy/foraminotomies/medial facetectomy to decompress the bilateral L4 and L5 nerve roots(the work required to do this was in addition to the work required to do the posterior lumbar interbody fusion because of the patient's spinal stenosis, facet arthropathy. Etc. requiring a wide decompression of the nerve roots.);  L4-5 transforaminal lumbar interbody fusion with local morselized autograft bone and Zimmer DBM; insertion of interbody prosthesis at L4-5 (globus peek expandable interbody prosthesis); posterior nonsegmental instrumentation from L4 to L5 with globus titanium pedicle screws and rods; posterior lateral arthrodesis at L4-5 with local morselized autograft bone and Zimmer DBM.  Surgeon: Dr. Earle Gell  Asst.: Dr. Dayton Bailiff and Arnetha Massy nurse practitioner  Anesthesia: Gen. endotracheal  Estimated blood loss: 200 cc  Drains: None  Complications: None  Description of procedure: The patient was brought to the operating room by the anesthesia team. General endotracheal anesthesia was induced. The patient was turned to the prone position on the Wilson frame. The patient's lumbosacral region was then prepared with Betadine scrub and Betadine solution.  Sterile drapes were applied.  I then injected the area to be incised with Marcaine with epinephrine solution. I then used the scalpel to make a linear midline incision over the L4-5 interspace. I then used electrocautery to perform a bilateral subperiosteal dissection exposing the spinous process and lamina of L4 and L5. We then obtained intraoperative radiograph to confirm our location. We then inserted the Verstrac retractor to provide exposure.  I began the decompression by using the high speed drill to perform laminotomies at L4-5 bilaterally. We then used the Kerrison punches to widen the laminotomy and removed the ligamentum flavum at L4-5 bilaterally. We used the Kerrison punches to remove the medial facets at L4-5 bilaterally. We performed wide foraminotomies about the bilateral L4 and L5 nerve roots completing the decompression.  We now turned our attention to the posterior lumbar interbody fusion. I used a scalpel to incise the intervertebral disc at L4-5. I then performed a partial intervertebral discectomy at L4-5 using the pituitary forceps. We prepared the vertebral endplates at X5-2 for the fusion by removing the soft tissues with the curettes. We then used the trial spacers to pick the appropriate sized interbody prosthesis. We prefilled his prosthesis with a combination of local morselized autograft bone that we obtained during the decompression as well as Zimmer DBM. We inserted the prefilled prosthesis into the interspace at L4-5, we then turned and expanded the prosthesis. There was a good snug fit of the prosthesis in the interspace. We then filled and the remainder of the intervertebral disc space with local morselized autograft bone and Zimmer DBM. This completed the posterior lumbar interbody arthrodesis.  During the decompression and insertion of the prosthesis the assistant protected the thecal sac and nerve roots with the D'Errico retractor.  We now turned attention to the  instrumentation. Under fluoroscopic guidance we cannulated  the bilateral L4 and L5 pedicles with the bone probe. We then removed the bone probe. We then tapped the pedicle with a 6.5 millimeter tap. We then removed the tap. We probed inside the tapped pedicle with a ball probe to rule out cortical breaches. We then inserted a 7.5 x 50 millimeter pedicle screw into the L4 and L5 pedicles bilaterally under fluoroscopic guidance. We then palpated along the medial aspect of the pedicles to rule out cortical breaches. There were none. The nerve roots were not injured. We then connected the unilateral pedicle screws with a lordotic rod. We compressed the construct and secured the rod in place with the caps. We then tightened the caps appropriately. This completed the instrumentation from L4-5 bilaterally.  We now turned our attention to the posterior lateral arthrodesis at L4-5 bilaterally. We used the high-speed drill to decorticate the remainder of the facets, pars, transverse process at L4-5 bilaterally. We then applied a combination of local morselized autograft bone and Zimmer DBM over these decorticated posterior lateral structures. This completed the posterior lateral arthrodesis.  We then obtained hemostasis using bipolar electrocautery. We irrigated the wound out with bacitracin solution. We inspected the thecal sac and nerve roots and noted they were well decompressed. We then removed the retractor.  We injected Exparel . We reapproximated patient's thoracolumbar fascia with interrupted #1 Vicryl suture. We reapproximated patient's subcutaneous tissue with interrupted 2-0 Vicryl suture. The reapproximated patient's skin with Steri-Strips and benzoin. The wound was then coated with bacitracin ointment. A sterile dressing was applied. The drapes were removed. The patient was subsequently returned to the supine position where they were extubated by the anesthesia team. He was then transported to the post  anesthesia care unit in stable condition. All sponge instrument and needle counts were reportedly correct at the end of this case.

## 2019-05-20 NOTE — Transfer of Care (Signed)
Immediate Anesthesia Transfer of Care Note  Patient: Nathan Turner  Procedure(s) Performed: POSTERIOR LUMBAR INTERBODY FUSION, INTERBODY PROSTHESIS, POSTERIOR INSTRUMENTATION, LUMBAR FOUR- LUMBAR FIVE (N/A Spine Lumbar)  Patient Location: PACU  Anesthesia Type:General  Level of Consciousness: drowsy and patient cooperative  Airway & Oxygen Therapy: Patient Spontanous Breathing and Patient connected to nasal cannula oxygen  Post-op Assessment: Report given to RN, Post -op Vital signs reviewed and stable and Patient moving all extremities  Post vital signs: Reviewed and stable  Last Vitals:  Vitals Value Taken Time  BP 127/64 05/20/19 1102  Temp    Pulse 71 05/20/19 1105  Resp 20 05/20/19 1105  SpO2 92 % 05/20/19 1105  Vitals shown include unvalidated device data.  Last Pain:  Vitals:   05/20/19 0626  TempSrc: Oral  PainSc:       Patients Stated Pain Goal: 3 (58/72/76 1848)  Complications: No apparent anesthesia complications

## 2019-05-20 NOTE — Evaluation (Signed)
Physical Therapy Evaluation Patient Details Name: Nathan Turner MRN: [id number] DOB: 1945-07-30 Today's Date: 05/20/2019   History of Present Illness  74 yo male s/p PLIF L4-5 on 05/19/18. PMH includes anxiety, OA, cancer, DM, HTN.  Clinical Impression   Pt presents with decreased knowledge of back precautions, low back pain, increased time and effort to mobilize, and decreased activity tolerance. Pt to benefit from acute PT to address deficits. Pt ambulated hallway distance with min guard assist for safety, verbal cuing for maintenance of back precautions throughout mobility. Handout for back precautions administered, reviewed, and practiced with pt, pt with no further questions this pm. PT to progress mobility as tolerated, and will continue to follow acutely.      Follow Up Recommendations Follow surgeon's recommendation for DC plan and follow-up therapies;Supervision for mobility/OOB    Equipment Recommendations  None recommended by PT    Recommendations for Other Services       Precautions / Restrictions Precautions Precautions: Fall;Back Precaution Booklet Issued: Yes (comment) Precaution Comments: handout administered, reviewed, and practiced with pt - no bending, lifting, twisting, arching spine, as well as log roll technique Required Braces or Orthoses: Spinal Brace Spinal Brace: Lumbar corset;Applied in sitting position;Other (comment) Spinal Brace Comments: pt did not bring brace with him to hospital, RN okayed pt mobilizing without it Restrictions Weight Bearing Restrictions: No      Mobility  Bed Mobility Overal bed mobility: Needs Assistance Bed Mobility: Rolling;Sidelying to Sit;Sit to Sidelying Rolling: Supervision Sidelying to sit: Supervision     Sit to sidelying: Min assist General bed mobility comments: supervision for roll, sidelying to sit for safety with verbal cuing for safe log roll technique. Min assist for sit to sidelying for LE lifting into  bed.  Transfers Overall transfer level: Needs assistance Equipment used: None Transfers: Sit to/from Stand Sit to Stand: Supervision         General transfer comment: supervision for safety, verbal cuing for bending from hips and knees.when sitting and standing  Ambulation/Gait Ambulation/Gait assistance: Min guard Gait Distance (Feet): 200 Feet Assistive device: None Gait Pattern/deviations: Step-through pattern;Decreased stride length Gait velocity: decr   General Gait Details: min guard for safety, pt with some unsteadiness noted but always self-corrected.  Stairs            Wheelchair Mobility    Modified Rankin (Stroke Patients Only)       Balance Overall balance assessment: Mild deficits observed, not formally tested                                           Pertinent Vitals/Pain Pain Assessment: 0-10 Pain Score: 5  Pain Location: back Pain Descriptors / Indicators: Sore;Discomfort Pain Intervention(s): Limited activity within patient's tolerance;Monitored during session;Premedicated before session;Repositioned    Home Living Family/patient expects to be discharged to:: Private residence Living Arrangements: Spouse/significant other Available Help at Discharge: Family;Available 24 hours/day Type of Home: House Home Access: Stairs to enter Entrance Stairs-Rails: Psychiatric nurse of Steps: 4 Home Layout: One level Home Equipment: Walker - 2 wheels;Cane - single point;Grab bars - toilet      Prior Function Level of Independence: Independent               Hand Dominance   Dominant Hand: Right    Extremity/Trunk Assessment   Upper Extremity Assessment Upper Extremity Assessment: Overall WFL for tasks assessed  Lower Extremity Assessment Lower Extremity Assessment: Overall WFL for tasks assessed    Cervical / Trunk Assessment Cervical / Trunk Assessment: Normal;Other exceptions Cervical / Trunk  Exceptions: s/p lumbar fusion  Communication   Communication: No difficulties  Cognition Arousal/Alertness: Awake/alert Behavior During Therapy: WFL for tasks assessed/performed Overall Cognitive Status: Within Functional Limits for tasks assessed                                        General Comments      Exercises     Assessment/Plan    PT Assessment Patient needs continued PT services  PT Problem List Decreased mobility;Decreased safety awareness;Decreased knowledge of precautions;Decreased activity tolerance;Decreased balance;Pain       PT Treatment Interventions DME instruction;Therapeutic activities;Gait training;Therapeutic exercise;Patient/family education;Balance training;Functional mobility training;Stair training    PT Goals (Current goals can be found in the Care Plan section)  Acute Rehab PT Goals Patient Stated Goal: go home ASAP PT Goal Formulation: With patient Time For Goal Achievement: 05/27/19 Potential to Achieve Goals: Good    Frequency Min 5X/week   Barriers to discharge        Co-evaluation               AM-PAC PT "6 Clicks" Mobility  Outcome Measure Help needed turning from your back to your side while in a flat bed without using bedrails?: A Little Help needed moving from lying on your back to sitting on the side of a flat bed without using bedrails?: A Little Help needed moving to and from a bed to a chair (including a wheelchair)?: A Little Help needed standing up from a chair using your arms (e.g., wheelchair or bedside chair)?: A Little Help needed to walk in hospital room?: A Little Help needed climbing 3-5 steps with a railing? : A Little 6 Click Score: 18    End of Session Equipment Utilized During Treatment: Other (comment)(pt left back brace at home) Activity Tolerance: Patient tolerated treatment well;Patient limited by pain Patient left: in bed;with call bell/phone within reach Nurse Communication:  Mobility status PT Visit Diagnosis: Other abnormalities of gait and mobility (R26.89);Pain Pain - Right/Left: (low) Pain - part of body: (back)    Time: 4081-4481 PT Time Calculation (min) (ACUTE ONLY): 18 min   Charges:   PT Evaluation $PT Eval Low Complexity: 1 Low        Jai Bear E, PT Acute Rehabilitation Services Pager [phone]  Office [phone]  Jaedynn Bohlken D Elonda Husky 05/20/2019, 5:36 PM

## 2019-05-21 LAB — CBC
HCT: 37.9 % — ABNORMAL LOW (ref 39.0–52.0)
Hemoglobin: 12.5 g/dL — ABNORMAL LOW (ref 13.0–17.0)
MCH: 31.2 pg (ref 26.0–34.0)
MCHC: 33 g/dL (ref 30.0–36.0)
MCV: 94.5 fL (ref 80.0–100.0)
Platelets: 157 10*3/uL (ref 150–400)
RBC: 4.01 MIL/uL — ABNORMAL LOW (ref 4.22–5.81)
RDW: 13.7 % (ref 11.5–15.5)
WBC: 10.3 10*3/uL (ref 4.0–10.5)
nRBC: 0 % (ref 0.0–0.2)

## 2019-05-21 LAB — BASIC METABOLIC PANEL
Anion gap: 6 (ref 5–15)
BUN: 15 mg/dL (ref 8–23)
CO2: 26 mmol/L (ref 22–32)
Calcium: 8.1 mg/dL — ABNORMAL LOW (ref 8.9–10.3)
Chloride: 104 mmol/L (ref 98–111)
Creatinine, Ser: 0.88 mg/dL (ref 0.61–1.24)
GFR calc Af Amer: >60 mL/min (ref >60)
GFR calc non Af Amer: >60 mL/min (ref >60)
Glucose, Bld: 134 mg/dL — ABNORMAL HIGH (ref 70–99)
Potassium: 3.9 mmol/L (ref 3.5–5.1)
Sodium: 136 mmol/L (ref 135–145)

## 2019-05-21 LAB — GLUCOSE, CAPILLARY: Glucose-Capillary: 130 mg/dL — ABNORMAL HIGH (ref 70–99)

## 2019-05-21 MED ORDER — CYCLOBENZAPRINE HCL 10 MG PO TABS: 10.0000 mg | ORAL_TABLET | Freq: Three times a day (TID) | ORAL | 0 refills | Status: DC | PRN

## 2019-05-21 MED ORDER — DOCUSATE SODIUM 100 MG PO CAPS: 100.0000 mg | ORAL_CAPSULE | Freq: Two times a day (BID) | ORAL | 0 refills | Status: DC

## 2019-05-21 MED ORDER — OXYCODONE HCL 10 MG PO TABS: 10.0000 mg | ORAL_TABLET | ORAL | 0 refills | Status: DC | PRN

## 2019-05-21 MED FILL — Heparin Sodium (Porcine) Inj 1000 Unit/ML: INTRAMUSCULAR | Qty: 30 | Status: AC

## 2019-05-21 MED FILL — Sodium Chloride Irrigation Soln 0.9%: Qty: 3000 | Status: AC

## 2019-05-21 NOTE — TOC Initial Note (Signed)
Transition of Care [facility]) - Initial/Assessment Note    Patient Details  Name: Nathan Turner MRN: [id number] Date of Birth: Jun 15, 1945  [facility] CM/SW Contact:    Bethena Roys, RN Phone Number: 05/21/2019, 11:23 AM  Clinical Narrative:   Patient post surgical procedure for  L4-L5 spondylolisthesis. Patient in need for Home Health Physical Therapy and Occupational Therapy. Choice offered and patient chose [facility]- Referral sent to [facility]- start of care to begin on 21-1-21. Case Manager called son to make him aware. Unable to get patient's wife on the phone. No further need from the Case Manager.                 Expected Discharge Plan: [place] Barriers to Discharge: No Barriers Identified   Patient Goals and CMS Choice Patient states their goals for this hospitalization and ongoing recovery are:: "to return home" CMS Medicare.gov Compare Post Acute Care list provided to:: Patient Choice offered to / list presented to : Patient  Expected Discharge Plan and Services Expected Discharge Plan: [place] In-house Referral: NA Discharge Planning Services: CM Consult Post Acute Care Choice: [place] arrangements for the past 2 months: [place] Expected Discharge Date: 05/21/19                  HH Arranged: PT, OT HH Agency: [facility] ([facility]) Date [place]: 05/21/19 Time [place]: 1122 Representative spoke with at [place]: [place] Arrangements/Services Living arrangements for the past 2 months: [place] Lives with:: Spouse Patient language and need for interpreter reviewed:: Yes Do you feel safe going back to the place where you live?: Yes      Need for Family Participation in Patient Care: Yes (Comment) Care giver support system in place?: Yes (comment)   Criminal Activity/Legal Involvement Pertinent to  Current Situation/Hospitalization: No - Comment as needed  Activities of Daily Living Home Assistive Devices/Equipment: Eyeglasses, Dentures (specify type), CBG Meter, Blood pressure cuff, Scales, Hand-held shower hose(full upper/lower dentures) ADL Screening (condition at time of admission) Patient's cognitive ability adequate to safely complete daily activities?: Yes Is the patient deaf or have difficulty hearing?: Yes Does the patient have difficulty seeing, even when wearing glasses/contacts?: No Does the patient have difficulty concentrating, remembering, or making decisions?: No Patient able to express need for assistance with ADLs?: Yes Does the patient have difficulty dressing or bathing?: Yes Independently performs ADLs?: No Communication: Independent Dressing (OT): Needs assistance Is this a change from baseline?: Change from baseline, expected to last <3days Grooming: Independent Feeding: Independent Bathing: Needs assistance Is this a change from baseline?: Change from baseline, expected to last <3 days Toileting: Needs assistance Is this a change from baseline?: Change from baseline, expected to last <3 days In/Out Bed: Needs assistance Is this a change from baseline?: Change from baseline, expected to last <3 days Does the patient have difficulty walking or climbing stairs?: Yes Weakness of Legs: Both Weakness of Arms/Hands: None  Permission Sought/Granted Permission sought to share information with : Case Manager, Customer service manager, Family Supports Permission granted to share information with : Yes, Verbal Permission Granted     Permission granted to share info w AGENCY: [facility]        Emotional Assessment Appearance:: Appears stated age Attitude/Demeanor/Rapport: Engaged Affect (typically observed): Appropriate Orientation: : Oriented to Self, Oriented to Place Alcohol / Substance  Use: Not Applicable Psych Involvement: No  (comment)  Admission diagnosis:  Spondylolisthesis, lumbar region [M43.16] Patient Active Problem List   Diagnosis Date Noted  . Spondylolisthesis, lumbar region 05/20/2019   PCP:  Arsenio Katz, NP Pharmacy:   [facility] [place] [address] [place] [address] Phone: [phone] Fax: [phone]     Social Determinants of Health (SDOH) Interventions    Readmission Risk Interventions No flowsheet data found.

## 2019-05-21 NOTE — Progress Notes (Signed)
Patient is discharged from room 3C04 at this time. Alert and in stable condition. IV site d/c'd and instructions read to patient and grandson with understanding verbalized. Left unit via wheelchair with all belongings at side.

## 2019-05-21 NOTE — Discharge Summary (Signed)
Physician Discharge Summary     Providing Compassionate, Quality Care - Together   Patient ID: Nathan Turner MRN: [id number] DOB/AGE: 10/29/1945 74 y.o.  Admit date: 05/20/2019 Discharge date: 05/21/2019  Admission Diagnoses: Spondylolisthesis, lumbar region  Discharge Diagnoses:  Active Problems:   Spondylolisthesis, lumbar region   Discharged Condition: good  Hospital Course: Patient was admitted to 3C04 following L4-5 PLIF by Dr. Arnoldo Morale on 05/20/2019. His post operative course was uncomplicated. He has worked with both physical and occupational therapies who are recommending home health PT and OT. His pain is well-controlled with his current medication regimen. He is ready for discharge home.  Consults: rehabilitation medicine  Significant Diagnostic Studies: radiology: DG Lumbar Spine 2-3 Views  Result Date: 05/20/2019 CLINICAL DATA:  Lumbar fusion EXAM: LUMBAR SPINE - 2-3 VIEW; DG C-ARM 1-60 MIN COMPARISON:  None. FLUOROSCOPY TIME:  Fluoroscopy Time:  12 seconds Radiation Exposure Index (if provided by the fluoroscopic device): Not available Number of Acquired Spot Images: 2 FINDINGS: Changes of interbody fusion at L4-5 are noted with pedicle screw placement. Posterior fixation elements have not been placed. IMPRESSION: L4-5 fusion. Electronically Signed   By: Inez Catalina M.D.   On: 05/20/2019 10:35   DG Lumbar Spine 1 View  Result Date: 05/20/2019 CLINICAL DATA:  Localization in OR EXAM: LUMBAR SPINE - 1 VIEW COMPARISON:  Portable exam 0818 hours compared to 05/11/2019 and MRI of 05/04/2019 FINDINGS: Prior MRI labeled with 5 lumbar vertebra. Metallic probe via dorsal approach projects dorsal to the superior endplate of L5. Tissue spreader and surgical sponge seen dorsally at the L4-L5 level. IMPRESSION: Dorsal localization of the superior endplate of L5. Electronically Signed   By: Lavonia Dana M.D.   On: 05/20/2019 11:03   DG C-Arm 1-60 Min  Result Date: 05/20/2019 CLINICAL  DATA:  Lumbar fusion EXAM: LUMBAR SPINE - 2-3 VIEW; DG C-ARM 1-60 MIN COMPARISON:  None. FLUOROSCOPY TIME:  Fluoroscopy Time:  12 seconds Radiation Exposure Index (if provided by the fluoroscopic device): Not available Number of Acquired Spot Images: 2 FINDINGS: Changes of interbody fusion at L4-5 are noted with pedicle screw placement. Posterior fixation elements have not been placed. IMPRESSION: L4-5 fusion. Electronically Signed   By: Inez Catalina M.D.   On: 05/20/2019 10:35     Treatments: surgery: Bilateral L4-5 laminotomy/foraminotomies/medial facetectomy to decompress the bilateral L4 and L5 nerve roots(the work required to do this was in addition to the work required to do the posterior lumbar interbody fusion because of the patient's spinal stenosis, facet arthropathy. Etc. requiring a wide decompression of the nerve roots.);  L4-5 transforaminal lumbar interbody fusion with local morselized autograft bone and Zimmer DBM; insertion of interbody prosthesis at L4-5 (globus peek expandable interbody prosthesis); posterior nonsegmental instrumentation from L4 to L5 with globus titanium pedicle screws and rods; posterior lateral arthrodesis at L4-5 with local morselized autograft bone and Zimmer DBM.  Discharge Exam: Blood pressure (!) 97/54, pulse 63, temperature 98.3 F (36.8 C), temperature source Oral, resp. rate 16, height 5' 8.5" (1.74 m), weight 73.2 kg, SpO2 94 %.   Alert and oriented x 4 PERRLA CN II-XII grossly intact MAE, Strength and sensation intact Incision is covered with Honeycomb dressing and Steri Strips; Dressing is clean, dry, and intact   Disposition: Discharge disposition: 01-Home or Self Care       Discharge Instructions    Face-to-face encounter (required for Medicare/Medicaid patients)   Complete by: As directed    I Patricia Nettle certify that  this patient is under my care and that I, or a nurse practitioner or physician's assistant working with me, had a  face-to-face encounter that meets the physician face-to-face encounter requirements with this patient on 05/21/2019. The encounter with the patient was in whole, or in part for the following medical condition(s) which is the primary reason for home health care (List medical condition): L4-5 spondylolisthesis; Status post L4-5 PLIF   The encounter with the patient was in whole, or in part, for the following medical condition, which is the primary reason for home health care: L4-5 spondylolisthesis; Status post O6-7 PLIF   I certify that, based on my findings, the following services are medically necessary home health services: Physical therapy   Reason for Medically Necessary Home Health Services: Therapy- Therapeutic Exercises to Increase Strength and Endurance   My clinical findings support the need for the above services: Unable to leave home safely without assistance and/or assistive device   Further, I certify that my clinical findings support that this patient is homebound due to: Unable to leave home safely without assistance   Home Health   Complete by: As directed    To provide the following care/treatments:  PT OT       Allergies as of 05/21/2019   No Known Allergies     Medication List    STOP taking these medications   traMADol 50 MG tablet Commonly known as: ULTRAM     TAKE these medications   aspirin EC 81 MG tablet Take 81 mg by mouth every other day.   Cartia XT 120 MG 24 hr capsule Generic drug: diltiazem Take 120 mg by mouth daily.   citalopram 20 MG tablet Commonly known as: CELEXA Take 20 mg by mouth daily.   cyclobenzaprine 10 MG tablet Commonly known as: FLEXERIL Take 1 tablet (10 mg total) by mouth 3 (three) times daily as needed for muscle spasms.   docusate sodium 100 MG capsule Commonly known as: COLACE Take 1 capsule (100 mg total) by mouth 2 (two) times daily.   Fish Oil 1200 MG Caps Take 1,200 mg by mouth daily.   lovastatin 20 MG  tablet Commonly known as: MEVACOR Take 20 mg by mouth daily.   Oxycodone HCl 10 MG Tabs Take 1 tablet (10 mg total) by mouth every 4 (four) hours as needed for severe pain ((score 7 to 10)).   rOPINIRole 1 MG tablet Commonly known as: REQUIP Take 1 mg by mouth at bedtime.   temazepam 15 MG capsule Commonly known as: RESTORIL Take 15 mg by mouth at bedtime.      Follow-up Information    Newman Pies, MD. Schedule an appointment as soon as possible for a visit in 2 week(s).   Specialty: Neurosurgery Contact information: [address] [place] [address] [place] [address] [phone]           Signed: Patricia Nettle 05/21/2019, 9:50 AM

## 2019-05-21 NOTE — Discharge Instructions (Signed)

## 2019-05-21 NOTE — Evaluation (Addendum)
Occupational Therapy Evaluation Patient Details Name: Nathan Turner MRN: [id number] DOB: [birth year] Today's Date: 05/21/2019    History of Present Illness 74 yo male s/p PLIF L4-5 on 05/19/18. PMH includes anxiety, OA, cancer, DM, HTN.   Clinical Impression   PTA patient independent. Admitted for above and limited by problem list below, including impaired balance, decreased activity tolerance, impaired recall and adherence to back precautions, and decreased safety awareness. Patient educated on back precautions, ADL compensatory techniques, recommendations, safety. Patient currently requires min guard to mod assist for ADLs, min guard to close supervision for transfers and in room mobility.  He reports he has 24/7 support at home, family can provide assist as needed.  Educated on having assist for LB ADLs and shower transfers at this time, supervision when up and moving. Believe he will benefit from continued OT services while admitted and after dc at [facility] level in order to optimize independence, safety with ADLs, mobility.     Follow Up Recommendations  Home health OT;Supervision/Assistance - 24 hour    Equipment Recommendations  None recommended by OT    Recommendations for Other Services       Precautions / Restrictions Precautions Precautions: Fall;Back Precaution Booklet Issued: Yes (comment) Precaution Comments: reviewed precautions and handout, but requires cueing throughout session with poor adherence Required Braces or Orthoses: Spinal Brace Spinal Brace: Lumbar corset;Applied in sitting position;Other (comment) Spinal Brace Comments: no brace available, not in room- RN okay'd mobilization without brace Restrictions Weight Bearing Restrictions: No      Mobility Bed Mobility               General bed mobility comments: OOB in recliner upon entry  Transfers Overall transfer level: Needs assistance Equipment used: None Transfers: Sit to/from Stand Sit to Stand:  Min guard;Supervision         General transfer comment: min guard to close supervision for safety    Balance Overall balance assessment: Mild deficits observed, not formally tested                                         ADL either performed or assessed with clinical judgement   ADL Overall ADL's : Needs assistance/impaired     Grooming: Min guard;Standing   Upper Body Bathing: Min guard;Sitting   Lower Body Bathing: Minimal assistance;Sitting/lateral leans;Cueing for compensatory techniques;Cueing for back precautions   Upper Body Dressing : Minimal assistance;Sitting;Standing Upper Body Dressing Details (indicate cue type and reason): to pull shirt down in back  Lower Body Dressing: Moderate assistance;Sit to/from stand;Cueing for compensatory techniques;Cueing for back precautions Lower Body Dressing Details (indicate cue type and reason): figure 4 technique but continues to require assist to don clothing over feet, min guard in standing  Toilet Transfer: Min guard;Ambulation Toilet Transfer Details (indicate cue type and reason): for safety/balance         Functional mobility during ADLs: Min guard;Cueing for safety General ADL Comments: pt limited by impaired balance, back precautions, pain and cognition; patient educated on back precautions and ADL compensaotry techniques but requires constant cueing throughout session to adhere      Vision Baseline Vision/History: Wears glasses Patient Visual Report: No change from baseline Vision Assessment?: No apparent visual deficits     Perception     Praxis      Pertinent Vitals/Pain Pain Assessment: Faces Faces Pain Scale: Hurts even more Pain Location: back  Pain Descriptors / Indicators: Sore;Discomfort;Operative site guarding Pain Intervention(s): Limited activity within patient's tolerance;Monitored during session;Repositioned;Patient requesting pain meds-RN notified     Hand Dominance Right    Extremity/Trunk Assessment Upper Extremity Assessment Upper Extremity Assessment: Overall WFL for tasks assessed   Lower Extremity Assessment Lower Extremity Assessment: Defer to PT evaluation   Cervical / Trunk Assessment Cervical / Trunk Assessment: Other exceptions Cervical / Trunk Exceptions: s/p back surgery   Communication Communication Communication: No difficulties   Cognition Arousal/Alertness: Awake/alert Behavior During Therapy: WFL for tasks assessed/performed Overall Cognitive Status: No family/caregiver present to determine baseline cognitive functioning Area of Impairment: Memory;Following commands;Safety/judgement;Awareness;Problem solving;Attention                   Current Attention Level: Sustained Memory: Decreased recall of precautions;Decreased short-term memory Following Commands: Follows one step commands consistently;Follows one step commands with increased time;Follows multi-step commands inconsistently Safety/Judgement: Decreased awareness of safety;Decreased awareness of deficits Awareness: Emergent Problem Solving: Slow processing;Difficulty sequencing;Requires verbal cues General Comments: pt with poor awareness to safety and precautions, constant cueing during session    General Comments       Exercises     Shoulder Instructions      Home Living Family/patient expects to be discharged to:: Private residence Living Arrangements: Spouse/significant other Available Help at Discharge: Family;Available 24 hours/day Type of Home: House Home Access: Stairs to enter [employer] of Steps: 4 Entrance Stairs-Rails: Right;Left Home Layout: One level     Bathroom Shower/Tub: Teacher, early years/pre: Standard     Home Equipment: Environmental consultant - 2 wheels;Cane - single point;Grab bars - toilet;Grab bars - tub/shower;Shower seat;Bedside commode          Prior Functioning/Environment Level of Independence: Independent         Comments: reports independent ADLs, IADLs         OT Problem List: Decreased strength;Impaired balance (sitting and/or standing);Decreased activity tolerance;Decreased safety awareness;Decreased knowledge of use of DME or AE;Decreased knowledge of precautions;Pain;Decreased cognition      OT Treatment/Interventions: Self-care/ADL training;Energy conservation;DME and/or AE instruction;Therapeutic activities;Balance training;Patient/family education;Cognitive remediation/compensation;Therapeutic exercise    OT Goals(Current goals can be found in the care plan section) Acute Rehab OT Goals Patient Stated Goal: go home ASAP OT Goal Formulation: With patient Time For Goal Achievement: 06/04/19 Potential to Achieve Goals: Good  OT Frequency: Min 2X/week   Barriers to D/C:            Co-evaluation              AM-PAC OT "6 Clicks" Daily Activity     Outcome Measure Help from another person eating meals?: None Help from another person taking care of personal grooming?: A Little Help from another person toileting, which includes using toliet, bedpan, or urinal?: A Little Help from another person bathing (including washing, rinsing, drying)?: A Lot Help from another person to put on and taking off regular upper body clothing?: A Little Help from another person to put on and taking off regular lower body clothing?: A Lot 6 Click Score: 17   End of Session Nurse Communication: Mobility status;Precautions  Activity Tolerance: Patient tolerated treatment well Patient left: in chair;with call bell/phone within reach  OT Visit Diagnosis: Other abnormalities of gait and mobility (R26.89);Muscle weakness (generalized) (M62.81);Pain Pain - part of body: (back-incisional)                Time: 4132-4401 OT Time Calculation (min): 24 min Charges:  OT General Charges $  OT Visit: 1 Visit OT Evaluation $OT Eval Low Complexity: 1 Low OT Treatments $Self Care/Home Management : 8-22  mins  Jolaine Artist, OT Acute Rehabilitation Services Pager [phone] Office [phone]    Nathan Turner 05/21/2019, 9:55 AM

## 2019-05-21 NOTE — Progress Notes (Signed)
Physical Therapy Treatment Patient Details Name: Nathan Turner MRN: [id number] DOB: March 18, 1946 Today's Date: 05/21/2019    History of Present Illness Pt is a 74 yo male s/p PLIF L4-5 on 05/19/18. PMH includes anxiety, OA, cancer, DM, HTN.    PT Comments    Pt making steady progress with functional mobility. He was able to increase distance ambulated and participated in stair training this session as well. Plan is for pt to d/c home today with family support. Pt would continue to benefit from skilled physical therapy services at this time while admitted and after d/c to address the below listed limitations in order to improve overall safety and independence with functional mobility.    Follow Up Recommendations  Home health PT     Equipment Recommendations  None recommended by PT    Recommendations for Other Services       Precautions / Restrictions Precautions Precautions: Fall;Back Precaution Booklet Issued: Yes (comment) Precaution Comments: reviewed precautions and handout, but requires cueing throughout session with poor adherence Required Braces or Orthoses: Spinal Brace Spinal Brace: Lumbar corset;Applied in sitting position;Other (comment) Spinal Brace Comments: no brace available, not in room- RN okay'd mobilization without brace Restrictions Weight Bearing Restrictions: No    Mobility  Bed Mobility               General bed mobility comments: OOB in recliner upon entry  Transfers Overall transfer level: Needs assistance Equipment used: None Transfers: Sit to/from Stand Sit to Stand: Supervision         General transfer comment: supervision for safety; pt with very slow rise into standing  Ambulation/Gait Ambulation/Gait assistance: Min guard Gait Distance (Feet): 500 Feet Assistive device: None Gait Pattern/deviations: Step-through pattern;Decreased stride length Gait velocity: decreased   General Gait Details: min guard for safety, pt with  some unsteadiness noted but able to self-corrected   Stairs Stairs: Yes Stairs assistance: Min guard Stair Management: One rail Right;Step to pattern;Forwards Number of Stairs: 5 General stair comments: close min guard for safety as pt with moderate instability   Wheelchair Mobility    Modified Rankin (Stroke Patients Only)       Balance Overall balance assessment: Mild deficits observed, not formally tested                                          Cognition Arousal/Alertness: Awake/alert Behavior During Therapy: WFL for tasks assessed/performed Overall Cognitive Status: Impaired/Different from baseline Area of Impairment: Memory;Safety/judgement;Problem solving                   Current Attention Level: Sustained Memory: Decreased recall of precautions;Decreased short-term memory Following Commands: Follows one step commands consistently;Follows one step commands with increased time;Follows multi-step commands inconsistently Safety/Judgement: Decreased awareness of safety;Decreased awareness of deficits Awareness: Emergent Problem Solving: Slow processing;Difficulty sequencing;Requires verbal cues General Comments: pt with poor awareness to safety and precautions, constant cueing during session       Exercises      General Comments        Pertinent Vitals/Pain Pain Assessment: 0-10 Pain Score: 8  Faces Pain Scale: Hurts even more Pain Location: back Pain Descriptors / Indicators: Sore;Discomfort;Operative site guarding Pain Intervention(s): Monitored during session;Repositioned    Home Living Family/patient expects to be discharged to:: Private residence Living Arrangements: Spouse/significant other Available Help at Discharge: Family;Available 24 hours/day Type of Home: [place]  Access: Stairs to enter Entrance Stairs-Rails: Right;Left Home Layout: One level Home Equipment: Environmental consultant - 2 wheels;Cane - single point;Grab bars -  toilet;Grab bars - tub/shower;Shower seat;Bedside commode      Prior Function Level of Independence: Independent      Comments: reports independent ADLs, IADLs    PT Goals (current goals can now be found in the care plan section) Acute Rehab PT Goals Patient Stated Goal: go home ASAP PT Goal Formulation: With patient Time For Goal Achievement: 05/27/19 Potential to Achieve Goals: Good Progress towards PT goals: Progressing toward goals    Frequency    Min 5X/week      PT Plan Current plan remains appropriate    Co-evaluation              AM-PAC PT "6 Clicks" Mobility   Outcome Measure  Help needed turning from your back to your side while in a flat bed without using bedrails?: A Little Help needed moving from lying on your back to sitting on the side of a flat bed without using bedrails?: A Little Help needed moving to and from a bed to a chair (including a wheelchair)?: A Little Help needed standing up from a chair using your arms (e.g., wheelchair or bedside chair)?: A Little Help needed to walk in hospital room?: A Little Help needed climbing 3-5 steps with a railing? : A Little 6 Click Score: 18    End of Session   Activity Tolerance: Patient tolerated treatment well;Patient limited by pain Patient left: in chair;with call bell/phone within reach Nurse Communication: Mobility status PT Visit Diagnosis: Other abnormalities of gait and mobility (R26.89);Pain Pain - part of body: (back)     Time: 2992-4268 PT Time Calculation (min) (ACUTE ONLY): 15 min  Charges:  $Gait Training: 8-22 mins                     Anastasio Champion, DPT  Acute Rehabilitation Services Pager [phone] Office [place] 05/21/2019, 10:14 AM

## 2019-05-24 DIAGNOSIS — Z4789 Encounter for other orthopedic aftercare: Secondary | ICD-10-CM | POA: Diagnosis not present

## 2019-05-24 DIAGNOSIS — Z9181 History of falling: Secondary | ICD-10-CM | POA: Diagnosis not present

## 2019-05-24 DIAGNOSIS — Z981 Arthrodesis status: Secondary | ICD-10-CM | POA: Diagnosis not present

## 2019-05-24 DIAGNOSIS — Z79891 Long term (current) use of opiate analgesic: Secondary | ICD-10-CM | POA: Diagnosis not present

## 2019-05-24 DIAGNOSIS — E119 Type 2 diabetes mellitus without complications: Secondary | ICD-10-CM | POA: Diagnosis not present

## 2019-05-24 DIAGNOSIS — I10 Essential (primary) hypertension: Secondary | ICD-10-CM | POA: Diagnosis not present

## 2019-05-24 DIAGNOSIS — Z7982 Long term (current) use of aspirin: Secondary | ICD-10-CM | POA: Diagnosis not present

## 2019-05-24 DIAGNOSIS — M1990 Unspecified osteoarthritis, unspecified site: Secondary | ICD-10-CM | POA: Diagnosis not present

## 2019-05-27 DIAGNOSIS — Z79891 Long term (current) use of opiate analgesic: Secondary | ICD-10-CM | POA: Diagnosis not present

## 2019-05-27 DIAGNOSIS — Z4789 Encounter for other orthopedic aftercare: Secondary | ICD-10-CM | POA: Diagnosis not present

## 2019-05-27 DIAGNOSIS — M1990 Unspecified osteoarthritis, unspecified site: Secondary | ICD-10-CM | POA: Diagnosis not present

## 2019-05-27 DIAGNOSIS — Z9181 History of falling: Secondary | ICD-10-CM | POA: Diagnosis not present

## 2019-05-27 DIAGNOSIS — Z7982 Long term (current) use of aspirin: Secondary | ICD-10-CM | POA: Diagnosis not present

## 2019-05-27 DIAGNOSIS — E119 Type 2 diabetes mellitus without complications: Secondary | ICD-10-CM | POA: Diagnosis not present

## 2019-05-27 DIAGNOSIS — Z981 Arthrodesis status: Secondary | ICD-10-CM | POA: Diagnosis not present

## 2019-05-27 DIAGNOSIS — I10 Essential (primary) hypertension: Secondary | ICD-10-CM | POA: Diagnosis not present

## 2019-05-28 ENCOUNTER — Encounter (INDEPENDENT_AMBULATORY_CARE_PROVIDER_SITE_OTHER): Payer: Self-pay

## 2019-05-28 DIAGNOSIS — M545 Low back pain: Secondary | ICD-10-CM | POA: Diagnosis not present

## 2019-05-28 DIAGNOSIS — Z6824 Body mass index (BMI) 24.0-24.9, adult: Secondary | ICD-10-CM | POA: Diagnosis not present

## 2019-05-28 DIAGNOSIS — I10 Essential (primary) hypertension: Secondary | ICD-10-CM | POA: Diagnosis not present

## 2019-05-28 DIAGNOSIS — Z09 Encounter for follow-up examination after completed treatment for conditions other than malignant neoplasm: Secondary | ICD-10-CM | POA: Diagnosis not present

## 2019-05-31 DIAGNOSIS — Z7982 Long term (current) use of aspirin: Secondary | ICD-10-CM | POA: Diagnosis not present

## 2019-05-31 DIAGNOSIS — I10 Essential (primary) hypertension: Secondary | ICD-10-CM | POA: Diagnosis not present

## 2019-05-31 DIAGNOSIS — M1990 Unspecified osteoarthritis, unspecified site: Secondary | ICD-10-CM | POA: Diagnosis not present

## 2019-05-31 DIAGNOSIS — Z4789 Encounter for other orthopedic aftercare: Secondary | ICD-10-CM | POA: Diagnosis not present

## 2019-05-31 DIAGNOSIS — E119 Type 2 diabetes mellitus without complications: Secondary | ICD-10-CM | POA: Diagnosis not present

## 2019-05-31 DIAGNOSIS — Z79891 Long term (current) use of opiate analgesic: Secondary | ICD-10-CM | POA: Diagnosis not present

## 2019-05-31 DIAGNOSIS — Z981 Arthrodesis status: Secondary | ICD-10-CM | POA: Diagnosis not present

## 2019-05-31 DIAGNOSIS — Z9181 History of falling: Secondary | ICD-10-CM | POA: Diagnosis not present

## 2019-06-03 DIAGNOSIS — E119 Type 2 diabetes mellitus without complications: Secondary | ICD-10-CM | POA: Diagnosis not present

## 2019-06-03 DIAGNOSIS — Z7982 Long term (current) use of aspirin: Secondary | ICD-10-CM | POA: Diagnosis not present

## 2019-06-03 DIAGNOSIS — I10 Essential (primary) hypertension: Secondary | ICD-10-CM | POA: Diagnosis not present

## 2019-06-03 DIAGNOSIS — Z79891 Long term (current) use of opiate analgesic: Secondary | ICD-10-CM | POA: Diagnosis not present

## 2019-06-03 DIAGNOSIS — M1990 Unspecified osteoarthritis, unspecified site: Secondary | ICD-10-CM | POA: Diagnosis not present

## 2019-06-03 DIAGNOSIS — Z981 Arthrodesis status: Secondary | ICD-10-CM | POA: Diagnosis not present

## 2019-06-03 DIAGNOSIS — Z9181 History of falling: Secondary | ICD-10-CM | POA: Diagnosis not present

## 2019-06-03 DIAGNOSIS — Z4789 Encounter for other orthopedic aftercare: Secondary | ICD-10-CM | POA: Diagnosis not present

## 2019-06-07 DIAGNOSIS — Z981 Arthrodesis status: Secondary | ICD-10-CM | POA: Diagnosis not present

## 2019-06-07 DIAGNOSIS — Z7982 Long term (current) use of aspirin: Secondary | ICD-10-CM | POA: Diagnosis not present

## 2019-06-07 DIAGNOSIS — I10 Essential (primary) hypertension: Secondary | ICD-10-CM | POA: Diagnosis not present

## 2019-06-07 DIAGNOSIS — M1990 Unspecified osteoarthritis, unspecified site: Secondary | ICD-10-CM | POA: Diagnosis not present

## 2019-06-07 DIAGNOSIS — Z4789 Encounter for other orthopedic aftercare: Secondary | ICD-10-CM | POA: Diagnosis not present

## 2019-06-07 DIAGNOSIS — Z79891 Long term (current) use of opiate analgesic: Secondary | ICD-10-CM | POA: Diagnosis not present

## 2019-06-07 DIAGNOSIS — Z9181 History of falling: Secondary | ICD-10-CM | POA: Diagnosis not present

## 2019-06-07 DIAGNOSIS — E119 Type 2 diabetes mellitus without complications: Secondary | ICD-10-CM | POA: Diagnosis not present

## 2019-06-09 DIAGNOSIS — Z4789 Encounter for other orthopedic aftercare: Secondary | ICD-10-CM | POA: Diagnosis not present

## 2019-06-09 DIAGNOSIS — M1990 Unspecified osteoarthritis, unspecified site: Secondary | ICD-10-CM | POA: Diagnosis not present

## 2019-06-09 DIAGNOSIS — Z981 Arthrodesis status: Secondary | ICD-10-CM | POA: Diagnosis not present

## 2019-06-09 DIAGNOSIS — E119 Type 2 diabetes mellitus without complications: Secondary | ICD-10-CM | POA: Diagnosis not present

## 2019-06-09 DIAGNOSIS — Z79891 Long term (current) use of opiate analgesic: Secondary | ICD-10-CM | POA: Diagnosis not present

## 2019-06-09 DIAGNOSIS — Z9181 History of falling: Secondary | ICD-10-CM | POA: Diagnosis not present

## 2019-06-09 DIAGNOSIS — Z7982 Long term (current) use of aspirin: Secondary | ICD-10-CM | POA: Diagnosis not present

## 2019-06-09 DIAGNOSIS — I10 Essential (primary) hypertension: Secondary | ICD-10-CM | POA: Diagnosis not present

## 2019-06-14 DIAGNOSIS — I10 Essential (primary) hypertension: Secondary | ICD-10-CM | POA: Diagnosis not present

## 2019-06-14 DIAGNOSIS — Z981 Arthrodesis status: Secondary | ICD-10-CM | POA: Diagnosis not present

## 2019-06-14 DIAGNOSIS — Z9181 History of falling: Secondary | ICD-10-CM | POA: Diagnosis not present

## 2019-06-14 DIAGNOSIS — Z7982 Long term (current) use of aspirin: Secondary | ICD-10-CM | POA: Diagnosis not present

## 2019-06-14 DIAGNOSIS — Z4789 Encounter for other orthopedic aftercare: Secondary | ICD-10-CM | POA: Diagnosis not present

## 2019-06-14 DIAGNOSIS — Z79891 Long term (current) use of opiate analgesic: Secondary | ICD-10-CM | POA: Diagnosis not present

## 2019-06-14 DIAGNOSIS — E119 Type 2 diabetes mellitus without complications: Secondary | ICD-10-CM | POA: Diagnosis not present

## 2019-06-14 DIAGNOSIS — M1990 Unspecified osteoarthritis, unspecified site: Secondary | ICD-10-CM | POA: Diagnosis not present

## 2019-06-15 DIAGNOSIS — E119 Type 2 diabetes mellitus without complications: Secondary | ICD-10-CM | POA: Diagnosis not present

## 2019-06-15 DIAGNOSIS — Z981 Arthrodesis status: Secondary | ICD-10-CM | POA: Diagnosis not present

## 2019-06-15 DIAGNOSIS — M1990 Unspecified osteoarthritis, unspecified site: Secondary | ICD-10-CM | POA: Diagnosis not present

## 2019-06-15 DIAGNOSIS — I10 Essential (primary) hypertension: Secondary | ICD-10-CM | POA: Diagnosis not present

## 2019-06-15 DIAGNOSIS — Z4789 Encounter for other orthopedic aftercare: Secondary | ICD-10-CM | POA: Diagnosis not present

## 2019-06-15 DIAGNOSIS — Z79891 Long term (current) use of opiate analgesic: Secondary | ICD-10-CM | POA: Diagnosis not present

## 2019-06-15 DIAGNOSIS — Z7982 Long term (current) use of aspirin: Secondary | ICD-10-CM | POA: Diagnosis not present

## 2019-06-15 DIAGNOSIS — Z9181 History of falling: Secondary | ICD-10-CM | POA: Diagnosis not present

## 2019-06-16 DIAGNOSIS — M5126 Other intervertebral disc displacement, lumbar region: Secondary | ICD-10-CM | POA: Diagnosis not present

## 2019-06-21 DIAGNOSIS — Z79891 Long term (current) use of opiate analgesic: Secondary | ICD-10-CM | POA: Diagnosis not present

## 2019-06-21 DIAGNOSIS — I10 Essential (primary) hypertension: Secondary | ICD-10-CM | POA: Diagnosis not present

## 2019-06-21 DIAGNOSIS — E119 Type 2 diabetes mellitus without complications: Secondary | ICD-10-CM | POA: Diagnosis not present

## 2019-06-21 DIAGNOSIS — M1990 Unspecified osteoarthritis, unspecified site: Secondary | ICD-10-CM | POA: Diagnosis not present

## 2019-06-21 DIAGNOSIS — Z9181 History of falling: Secondary | ICD-10-CM | POA: Diagnosis not present

## 2019-06-21 DIAGNOSIS — Z7982 Long term (current) use of aspirin: Secondary | ICD-10-CM | POA: Diagnosis not present

## 2019-06-21 DIAGNOSIS — Z4789 Encounter for other orthopedic aftercare: Secondary | ICD-10-CM | POA: Diagnosis not present

## 2019-06-21 DIAGNOSIS — Z981 Arthrodesis status: Secondary | ICD-10-CM | POA: Diagnosis not present

## 2019-06-24 DIAGNOSIS — F17210 Nicotine dependence, cigarettes, uncomplicated: Secondary | ICD-10-CM | POA: Diagnosis not present

## 2019-06-24 DIAGNOSIS — Z6824 Body mass index (BMI) 24.0-24.9, adult: Secondary | ICD-10-CM | POA: Diagnosis not present

## 2019-06-24 DIAGNOSIS — E7800 Pure hypercholesterolemia, unspecified: Secondary | ICD-10-CM | POA: Diagnosis not present

## 2019-06-24 DIAGNOSIS — E1165 Type 2 diabetes mellitus with hyperglycemia: Secondary | ICD-10-CM | POA: Diagnosis not present

## 2019-06-24 DIAGNOSIS — Z299 Encounter for prophylactic measures, unspecified: Secondary | ICD-10-CM | POA: Diagnosis not present

## 2019-06-24 DIAGNOSIS — J449 Chronic obstructive pulmonary disease, unspecified: Secondary | ICD-10-CM | POA: Diagnosis not present

## 2019-06-24 DIAGNOSIS — I739 Peripheral vascular disease, unspecified: Secondary | ICD-10-CM | POA: Diagnosis not present

## 2019-06-24 DIAGNOSIS — I10 Essential (primary) hypertension: Secondary | ICD-10-CM | POA: Diagnosis not present

## 2019-06-29 DIAGNOSIS — Z79891 Long term (current) use of opiate analgesic: Secondary | ICD-10-CM | POA: Diagnosis not present

## 2019-06-29 DIAGNOSIS — Z981 Arthrodesis status: Secondary | ICD-10-CM | POA: Diagnosis not present

## 2019-06-29 DIAGNOSIS — I10 Essential (primary) hypertension: Secondary | ICD-10-CM | POA: Diagnosis not present

## 2019-06-29 DIAGNOSIS — Z4789 Encounter for other orthopedic aftercare: Secondary | ICD-10-CM | POA: Diagnosis not present

## 2019-06-29 DIAGNOSIS — E119 Type 2 diabetes mellitus without complications: Secondary | ICD-10-CM | POA: Diagnosis not present

## 2019-06-29 DIAGNOSIS — Z9181 History of falling: Secondary | ICD-10-CM | POA: Diagnosis not present

## 2019-06-29 DIAGNOSIS — M1990 Unspecified osteoarthritis, unspecified site: Secondary | ICD-10-CM | POA: Diagnosis not present

## 2019-06-29 DIAGNOSIS — Z7982 Long term (current) use of aspirin: Secondary | ICD-10-CM | POA: Diagnosis not present

## 2019-07-05 DIAGNOSIS — I739 Peripheral vascular disease, unspecified: Secondary | ICD-10-CM | POA: Diagnosis not present

## 2019-07-05 DIAGNOSIS — I2510 Atherosclerotic heart disease of native coronary artery without angina pectoris: Secondary | ICD-10-CM | POA: Diagnosis not present

## 2019-07-08 DIAGNOSIS — H905 Unspecified sensorineural hearing loss: Secondary | ICD-10-CM | POA: Diagnosis not present

## 2019-07-13 DIAGNOSIS — E7800 Pure hypercholesterolemia, unspecified: Secondary | ICD-10-CM | POA: Diagnosis not present

## 2019-07-13 DIAGNOSIS — I10 Essential (primary) hypertension: Secondary | ICD-10-CM | POA: Diagnosis not present

## 2019-07-19 DIAGNOSIS — F17210 Nicotine dependence, cigarettes, uncomplicated: Secondary | ICD-10-CM | POA: Diagnosis not present

## 2019-07-19 DIAGNOSIS — M2550 Pain in unspecified joint: Secondary | ICD-10-CM | POA: Diagnosis not present

## 2019-07-19 DIAGNOSIS — Z299 Encounter for prophylactic measures, unspecified: Secondary | ICD-10-CM | POA: Diagnosis not present

## 2019-07-19 DIAGNOSIS — I10 Essential (primary) hypertension: Secondary | ICD-10-CM | POA: Diagnosis not present

## 2019-07-19 DIAGNOSIS — Z79899 Other long term (current) drug therapy: Secondary | ICD-10-CM | POA: Diagnosis not present

## 2019-08-13 DIAGNOSIS — E7800 Pure hypercholesterolemia, unspecified: Secondary | ICD-10-CM | POA: Diagnosis not present

## 2019-08-13 DIAGNOSIS — I10 Essential (primary) hypertension: Secondary | ICD-10-CM | POA: Diagnosis not present

## 2019-09-19 DIAGNOSIS — I10 Essential (primary) hypertension: Secondary | ICD-10-CM | POA: Diagnosis not present

## 2019-09-19 DIAGNOSIS — E7800 Pure hypercholesterolemia, unspecified: Secondary | ICD-10-CM | POA: Diagnosis not present

## 2019-09-23 DIAGNOSIS — M4316 Spondylolisthesis, lumbar region: Secondary | ICD-10-CM | POA: Diagnosis not present

## 2019-10-01 DIAGNOSIS — I10 Essential (primary) hypertension: Secondary | ICD-10-CM | POA: Diagnosis not present

## 2019-10-01 DIAGNOSIS — E1165 Type 2 diabetes mellitus with hyperglycemia: Secondary | ICD-10-CM | POA: Diagnosis not present

## 2019-10-01 DIAGNOSIS — J449 Chronic obstructive pulmonary disease, unspecified: Secondary | ICD-10-CM | POA: Diagnosis not present

## 2019-10-01 DIAGNOSIS — Z299 Encounter for prophylactic measures, unspecified: Secondary | ICD-10-CM | POA: Diagnosis not present

## 2019-10-20 DIAGNOSIS — E7800 Pure hypercholesterolemia, unspecified: Secondary | ICD-10-CM | POA: Diagnosis not present

## 2019-10-20 DIAGNOSIS — I10 Essential (primary) hypertension: Secondary | ICD-10-CM | POA: Diagnosis not present

## 2019-11-13 DIAGNOSIS — S68621A Partial traumatic transphalangeal amputation of left index finger, initial encounter: Secondary | ICD-10-CM | POA: Diagnosis not present

## 2019-11-13 DIAGNOSIS — S68121A Partial traumatic metacarpophalangeal amputation of left index finger, initial encounter: Secondary | ICD-10-CM | POA: Diagnosis not present

## 2019-11-13 DIAGNOSIS — W312XXA Contact with powered woodworking and forming machines, initial encounter: Secondary | ICD-10-CM | POA: Diagnosis not present

## 2019-11-13 DIAGNOSIS — Z23 Encounter for immunization: Secondary | ICD-10-CM | POA: Diagnosis not present

## 2019-11-15 ENCOUNTER — Encounter (HOSPITAL_BASED_OUTPATIENT_CLINIC_OR_DEPARTMENT_OTHER): Payer: Self-pay | Admitting: Orthopedic Surgery

## 2019-11-15 ENCOUNTER — Other Ambulatory Visit: Payer: Self-pay

## 2019-11-15 ENCOUNTER — Other Ambulatory Visit (HOSPITAL_COMMUNITY)
Admission: RE | Admit: 2019-11-15 | Discharge: 2019-11-15 | Disposition: A | Discharge status: Home / Self Care | Payer: Medicare Other | Source: Ambulatory Visit | Attending: Orthopedic Surgery | Admitting: Orthopedic Surgery

## 2019-11-15 ENCOUNTER — Other Ambulatory Visit: Payer: Self-pay | Admitting: Orthopedic Surgery

## 2019-11-15 DIAGNOSIS — Z01812 Encounter for preprocedural laboratory examination: Secondary | ICD-10-CM | POA: Diagnosis not present

## 2019-11-15 DIAGNOSIS — S68111A Complete traumatic metacarpophalangeal amputation of left index finger, initial encounter: Secondary | ICD-10-CM | POA: Diagnosis not present

## 2019-11-15 DIAGNOSIS — Z20822 Contact with and (suspected) exposure to covid-19: Secondary | ICD-10-CM | POA: Insufficient documentation

## 2019-11-15 LAB — SARS CORONAVIRUS 2 (TAT 6-24 HRS): SARS Coronavirus 2: NEGATIVE

## 2019-11-16 ENCOUNTER — Encounter (HOSPITAL_BASED_OUTPATIENT_CLINIC_OR_DEPARTMENT_OTHER)
Admission: RE | Disposition: A | Discharge status: Home / Self Care | Payer: Self-pay | Source: Home / Self Care | Attending: Orthopedic Surgery

## 2019-11-16 ENCOUNTER — Ambulatory Visit (HOSPITAL_BASED_OUTPATIENT_CLINIC_OR_DEPARTMENT_OTHER): Payer: Medicare Other | Admitting: Anesthesiology

## 2019-11-16 ENCOUNTER — Other Ambulatory Visit: Payer: Self-pay

## 2019-11-16 ENCOUNTER — Ambulatory Visit (HOSPITAL_BASED_OUTPATIENT_CLINIC_OR_DEPARTMENT_OTHER)
Admission: RE | Admit: 2019-11-16 | Discharge: 2019-11-16 | Disposition: A | Discharge status: Home / Self Care | Payer: Medicare Other | Attending: Orthopedic Surgery | Admitting: Orthopedic Surgery

## 2019-11-16 ENCOUNTER — Encounter (HOSPITAL_BASED_OUTPATIENT_CLINIC_OR_DEPARTMENT_OTHER): Payer: Self-pay | Admitting: Orthopedic Surgery

## 2019-11-16 DIAGNOSIS — I10 Essential (primary) hypertension: Secondary | ICD-10-CM | POA: Insufficient documentation

## 2019-11-16 DIAGNOSIS — S68621A Partial traumatic transphalangeal amputation of left index finger, initial encounter: Secondary | ICD-10-CM | POA: Diagnosis not present

## 2019-11-16 DIAGNOSIS — F17290 Nicotine dependence, other tobacco product, uncomplicated: Secondary | ICD-10-CM | POA: Diagnosis not present

## 2019-11-16 DIAGNOSIS — M479 Spondylosis, unspecified: Secondary | ICD-10-CM | POA: Insufficient documentation

## 2019-11-16 DIAGNOSIS — W312XXA Contact with powered woodworking and forming machines, initial encounter: Secondary | ICD-10-CM | POA: Insufficient documentation

## 2019-11-16 DIAGNOSIS — S68121A Partial traumatic metacarpophalangeal amputation of left index finger, initial encounter: Secondary | ICD-10-CM | POA: Diagnosis not present

## 2019-11-16 DIAGNOSIS — M4316 Spondylolisthesis, lumbar region: Secondary | ICD-10-CM | POA: Diagnosis not present

## 2019-11-16 HISTORY — DX: Other complications of anesthesia, initial encounter: T88.59XA

## 2019-11-16 HISTORY — DX: Complete traumatic metacarpophalangeal amputation of left index finger, initial encounter: S68.111A

## 2019-11-16 HISTORY — PX: AMPUTATION: SHX166

## 2019-11-16 HISTORY — DX: Prediabetes: R73.03

## 2019-11-16 SURGERY — AMPUTATION DIGIT
Anesthesia: General | Site: Finger | Laterality: Left

## 2019-11-16 MED ORDER — FENTANYL CITRATE (PF) 100 MCG/2ML IJ SOLN: 25.0000 ug | INTRAMUSCULAR | Status: DC | PRN

## 2019-11-16 MED ORDER — LACTATED RINGERS IV SOLN: INTRAVENOUS | Status: DC

## 2019-11-16 MED ORDER — CEFAZOLIN SODIUM-DEXTROSE 2-4 GM/100ML-% IV SOLN
2.0000 g | INTRAVENOUS | Status: AC
  Administered 2019-11-16: 2 g via INTRAVENOUS

## 2019-11-16 MED ORDER — ACETAMINOPHEN 500 MG PO TABS: 1000.0000 mg | ORAL_TABLET | Freq: Once | ORAL | Status: DC | PRN

## 2019-11-16 MED ORDER — PROPOFOL 500 MG/50ML IV EMUL
INTRAVENOUS | Status: DC | PRN
  Administered 2019-11-16: 125 ug/kg/min via INTRAVENOUS

## 2019-11-16 MED ORDER — CEFAZOLIN SODIUM-DEXTROSE 2-4 GM/100ML-% IV SOLN
INTRAVENOUS | Status: AC
  Filled 2019-11-16: qty 100

## 2019-11-16 MED ORDER — ACETAMINOPHEN 160 MG/5ML PO SOLN: 1000.0000 mg | Freq: Once | ORAL | Status: DC | PRN

## 2019-11-16 MED ORDER — LIDOCAINE 2% (20 MG/ML) 5 ML SYRINGE
INTRAMUSCULAR | Status: DC | PRN
  Administered 2019-11-16: 60 mg via INTRAVENOUS

## 2019-11-16 MED ORDER — OXYCODONE HCL 5 MG/5ML PO SOLN: 5.0000 mg | Freq: Once | ORAL | Status: DC | PRN

## 2019-11-16 MED ORDER — ACETAMINOPHEN 10 MG/ML IV SOLN: 1000.0000 mg | Freq: Once | INTRAVENOUS | Status: DC | PRN

## 2019-11-16 MED ORDER — PROPOFOL 10 MG/ML IV BOLUS
INTRAVENOUS | Status: DC | PRN
  Administered 2019-11-16: 150 mg via INTRAVENOUS

## 2019-11-16 MED ORDER — OXYCODONE HCL 5 MG PO TABS: 5.0000 mg | ORAL_TABLET | Freq: Once | ORAL | Status: DC | PRN

## 2019-11-16 MED ORDER — 0.9 % SODIUM CHLORIDE (POUR BTL) OPTIME
TOPICAL | Status: DC | PRN
Start: 2019-11-16 — End: 2019-11-16
  Administered 2019-11-16: 200 mL

## 2019-11-16 MED ORDER — FENTANYL CITRATE (PF) 100 MCG/2ML IJ SOLN
INTRAMUSCULAR | Status: AC
  Filled 2019-11-16: qty 2

## 2019-11-16 MED ORDER — BUPIVACAINE HCL (PF) 0.25 % IJ SOLN
INTRAMUSCULAR | Status: DC | PRN
  Administered 2019-11-16: 9.5 mL

## 2019-11-16 MED ORDER — FENTANYL CITRATE (PF) 100 MCG/2ML IJ SOLN
INTRAMUSCULAR | Status: DC | PRN
  Administered 2019-11-16 (×2): 50 ug via INTRAVENOUS

## 2019-11-16 SURGICAL SUPPLY — 49 items
APL PRP STRL LF DISP 70% ISPRP (MISCELLANEOUS)
BLADE MINI RND TIP GREEN BEAV (BLADE) IMPLANT
BLADE SURG 15 STRL LF DISP TIS (BLADE) ×2 IMPLANT
BLADE SURG 15 STRL SS (BLADE) ×6
BNDG CMPR 9X4 STRL LF SNTH (GAUZE/BANDAGES/DRESSINGS) ×1
BNDG COHESIVE 1X5 TAN STRL LF (GAUZE/BANDAGES/DRESSINGS) ×3 IMPLANT
BNDG ELASTIC 2X5.8 VLCR STR LF (GAUZE/BANDAGES/DRESSINGS) IMPLANT
BNDG ELASTIC 3X5.8 VLCR STR LF (GAUZE/BANDAGES/DRESSINGS) IMPLANT
BNDG ESMARK 4X9 LF (GAUZE/BANDAGES/DRESSINGS) ×3 IMPLANT
BNDG GAUZE 1X2.1 STRL (MISCELLANEOUS) IMPLANT
CHLORAPREP W/TINT 26 (MISCELLANEOUS) IMPLANT
CORD BIPOLAR FORCEPS 12FT (ELECTRODE) ×3 IMPLANT
COVER BACK TABLE 60X90IN (DRAPES) ×3 IMPLANT
COVER MAYO STAND STRL (DRAPES) ×3 IMPLANT
COVER WAND RF STERILE (DRAPES) IMPLANT
DECANTER SPIKE VIAL GLASS SM (MISCELLANEOUS) IMPLANT
DRAIN PENROSE 1/4X12 LTX STRL (WOUND CARE) IMPLANT
DRAPE EXTREMITY T 121X128X90 (DISPOSABLE) ×3 IMPLANT
DRAPE OEC MINIVIEW 54X84 (DRAPES) IMPLANT
DRAPE SURG 17X23 STRL (DRAPES) ×3 IMPLANT
GAUZE SPONGE 4X4 12PLY STRL (GAUZE/BANDAGES/DRESSINGS) IMPLANT
GAUZE SPONGE 4X4 12PLY STRL LF (GAUZE/BANDAGES/DRESSINGS) ×3 IMPLANT
GAUZE XEROFORM 1X8 LF (GAUZE/BANDAGES/DRESSINGS) ×3 IMPLANT
GLOVE BIO SURGEON STRL SZ7.5 (GLOVE) ×3 IMPLANT
GLOVE BIOGEL PI IND STRL 8 (GLOVE) ×1 IMPLANT
GLOVE BIOGEL PI INDICATOR 8 (GLOVE) ×2
GOWN STRL REUS W/ TWL LRG LVL3 (GOWN DISPOSABLE) ×1 IMPLANT
GOWN STRL REUS W/TWL LRG LVL3 (GOWN DISPOSABLE) ×3
GOWN STRL REUS W/TWL XL LVL3 (GOWN DISPOSABLE) ×6 IMPLANT
LOOP VESSEL MAXI BLUE (MISCELLANEOUS) ×3 IMPLANT
NEEDLE HYPO 25X1 1.5 SAFETY (NEEDLE) ×3 IMPLANT
NS IRRIG 1000ML POUR BTL (IV SOLUTION) ×3 IMPLANT
PACK BASIN DAY SURGERY FS (CUSTOM PROCEDURE TRAY) ×3 IMPLANT
PADDING CAST ABS 4INX4YD NS (CAST SUPPLIES)
PADDING CAST ABS COTTON 4X4 ST (CAST SUPPLIES) IMPLANT
SPLINT FINGER 3.25 911903 (SOFTGOODS) ×3 IMPLANT
STOCKINETTE 4X48 STRL (DRAPES) ×3 IMPLANT
SUT CHROMIC 4 0 P 3 18 (SUTURE) IMPLANT
SUT ETHILON 3 0 PS 1 (SUTURE) IMPLANT
SUT ETHILON 4 0 PS 2 18 (SUTURE) IMPLANT
SUT MERSILENE 4 0 P 3 (SUTURE) IMPLANT
SUT MON AB 5-0 P3 18 (SUTURE) ×3 IMPLANT
SUT VIC AB 4-0 P-3 18XBRD (SUTURE) IMPLANT
SUT VIC AB 4-0 P3 18 (SUTURE)
SYR BULB EAR ULCER 3OZ GRN STR (SYRINGE) ×3 IMPLANT
SYR CONTROL 10ML LL (SYRINGE) ×3 IMPLANT
TOWEL GREEN STERILE FF (TOWEL DISPOSABLE) ×3 IMPLANT
TRAY DSU PREP LF (CUSTOM PROCEDURE TRAY) ×3 IMPLANT
UNDERPAD 30X36 HEAVY ABSORB (UNDERPADS AND DIAPERS) ×3 IMPLANT

## 2019-11-16 NOTE — Transfer of Care (Signed)
Immediate Anesthesia Transfer of Care Note  Patient: Nathan Turner  Procedure(s) Performed: REVISION AMPUTATION LEFT INDEX FINGER (Left Finger)  Patient Location: PACU  Anesthesia Type:General  Level of Consciousness: awake  Airway & Oxygen Therapy: Patient Spontanous Breathing and Patient connected to face mask oxygen  Post-op Assessment: Report given to RN and Post -op Vital signs reviewed and stable  Post vital signs: Reviewed and stable  Last Vitals:  Vitals Value Taken Time  BP    Temp    Pulse 55 11/16/19 1552  Resp 16 11/16/19 1552  SpO2 100 % 11/16/19 1552  Vitals shown include unvalidated device data.  Last Pain:  Vitals:   11/16/19 1339  TempSrc: Oral  PainSc: 1       Patients Stated Pain Goal: 3 (23/76/28 3151)  Complications: No complications documented.

## 2019-11-16 NOTE — Discharge Instructions (Signed)
  Post Anesthesia Home Care Instructions  Activity: Get plenty of rest for the remainder of the day. A responsible individual must stay with you for 24 hours following the procedure.  For the next 24 hours, DO NOT: -Drive a car -Paediatric nurse -Drink alcoholic beverages -Take any medication unless instructed by your physician -Make any legal decisions or sign important papers.  Meals: Start with liquid foods such as gelatin or soup. Progress to regular foods as tolerated. Avoid greasy, spicy, heavy foods. If nausea and/or vomiting occur, drink only clear liquids until the nausea and/or vomiting subsides. Call your physician if vomiting continues.  Special Instructions/Symptoms: Your throat may feel dry or sore from the anesthesia or the breathing tube placed in your throat during surgery. If this causes discomfort, gargle with warm salt water. The discomfort should disappear within 24 hours.  If you had a scopolamine patch placed behind your ear for the management of post- operative nausea and/or vomiting:  1. The medication in the patch is effective for 72 hours, after which it should be removed.  Wrap patch in a tissue and discard in the trash. Wash hands thoroughly with soap and water. 2. You may remove the patch earlier than 72 hours if you experience unpleasant side effects which may include dry mouth, dizziness or visual disturbances. 3. Avoid touching the patch. Wash your hands with soap and water after contact with the patch.       Call your surgeon if you experience:   1.  Fever over 101.0. 2.  Inability to urinate. 3.  Nausea and/or vomiting. 4.  Extreme swelling or bruising at the surgical site. 5.  Continued bleeding from the incision. 6.  Increased pain, redness or drainage from the incision. 7.  Problems related to your pain medication. 8.  Any problems and/or concerns  [facility] Instructions Hand Surgery  Wound Care: Keep your hand elevated above the level  of your heart.  Do not allow it to dangle by your side.  Keep the dressing dry and do not remove it unless your doctor advises you to do so.  He will usually change it at the time of your post-op visit.  Moving your fingers is advised to stimulate circulation but will depend on the site of your surgery.  If you have a splint applied, your doctor will advise you regarding movement.  Activity: Do not drive or operate machinery today.  Rest today and then you may return to your normal activity and work as indicated by your physician.  Diet:  Drink liquids today or eat a light diet.  You may resume a regular diet tomorrow.    General expectations: Pain for two to three days. Fingers may become slightly swollen.  Call your doctor if any of the following occur: Severe pain not relieved by pain medication. Elevated temperature. Dressing soaked with blood. Inability to move fingers. White or bluish color to fingers.

## 2019-11-16 NOTE — Op Note (Signed)
NAME: Nathan Turner MEDICAL RECORD NO: [id number] DATE OF BIRTH: 02/03/1946 FACILITY: [facility] LOCATION: [facility] PHYSICIAN: Tennis Must, MD   OPERATIVE REPORT   DATE OF PROCEDURE: 11/16/19    PREOPERATIVE DIAGNOSIS:   Left index finger amputation   POSTOPERATIVE DIAGNOSIS:   Left index finger amputation   PROCEDURE:   Revision amputation left index finger through distal phalanx   SURGEON:  Leanora Cover, M.D.   ASSISTANT: Daryll Brod, MD   ANESTHESIA:  General   INTRAVENOUS FLUIDS:  Per anesthesia flow sheet.   ESTIMATED BLOOD LOSS:  Minimal.   COMPLICATIONS:  None.   SPECIMENS:  none   TOURNIQUET TIME:    Total Tourniquet Time Documented: Upper Arm (Left) - 16 minutes Total: Upper Arm (Left) - 16 minutes    DISPOSITION:  Stable to PACU.   INDICATIONS: 74 year old male states that 3 days ago he sustained amputation of left index finger from a table saw.  Was seen at outside emergency department where the wound was cleaned and dressed.  He was followed up in the office yesterday.  He wishes to proceed with revision amputation. Risks, benefits and alternatives of surgery were discussed including the risks of blood loss, infection, damage to nerves, vessels, tendons, ligaments, bone for surgery, need for additional surgery, complications with wound healing, continued pain,  stiffness.  He voiced understanding of these risks and elected to proceed.  OPERATIVE COURSE:  After being identified preoperatively by myself,  the patient and I agreed on the procedure and site of the procedure.  The surgical site was marked.  Surgical consent had been signed. He was given IV antibiotics as preoperative antibiotic prophylaxis. He was transferred to the operating room and placed on the operating table in supine position with the Left upper extremity on an arm board.  General anesthesia was induced by the anesthesiologist.  Left upper extremity was prepped and draped in  normal sterile orthopedic fashion.  A surgical pause was performed between the surgeons, anesthesia, and operating room staff and all were in agreement as to the patient, procedure, and site of procedure.  Tourniquet at the proximal aspect of the extremity was inflated to 250 mmHg after exsanguination of the arm with an Esmarch bandage.    The wound was explored.  There is a small amount of gross contamination with a black fibrous substance which was removed.  The remaining portion of nail within the wound was removed.  The nailbed and germinal matrix were sharply removed with a knife.  Bipolar electrocautery was used to obtain hemostasis.  Any devitalized tissues were sharply removed with the scissors and knife.  The subcutaneous tissues were mobilized around the bone with the knife and the bone rongeured back to allow good soft tissue coverage.  The wound was copiously irrigated with sterile saline.  5-0 Monocryl suture was used in an interrupted fashion to reapproximate soft tissues.  A vessel loop drain was placed within the wound given the time from injury.  A digital block was performed with quarter percent plain Marcaine to aid in postoperative analgesia.  The wound was dressed with sterile Xeroform 4 x 4 and wrapped with a Coban dressing lightly.  An AlumaFoam splint was placed and wrapped lightly with Coban dressing.  Tourniquet was deflated at 16 minutes.  Fingertips were pink with brisk capillary refill after deflation of tourniquet.  The operative  drapes were broken down.  The patient was awoken from anesthesia safely.  He was transferred  back to the stretcher and taken to PACU in stable condition.  I will see him back in the office in 1 week for postoperative followup.  He states he has tramadol at home and will use this for postoperative pain.   Leanora Cover, MD Electronically signed, 11/16/19

## 2019-11-16 NOTE — H&P (Signed)
  PAO HAFFEY is an 74 y.o. male.   Chief Complaint: left index amputation HPI: 74 yo male states he was using a table saw 11/13/19 and sustained an amputation of the left index finger.  Seen at outside ED where wound was dressed.  Followed up in office.  He wishes to proceed with revision amputation.   Allergies: No Known Allergies  Past Medical History:  Diagnosis Date  . Amputation of left index finger   . Anxiety   . Arthritis    back  . Complication of anesthesia    very loopy and confused  . Hypertension   . Pre-diabetes     Past Surgical History:  Procedure Laterality Date  . CERVICAL SPINE SURGERY    . COLONOSCOPY    . TONSILLECTOMY      Family History: History reviewed. No pertinent family history.  Social History:   reports that he has been smoking cigars. He has a 64.00 pack-year smoking history. He has never used smokeless tobacco. He reports that he does not drink alcohol and does not use drugs.  Medications: No medications prior to admission.    Results for orders placed or performed during the hospital encounter of 11/15/19 (from the past 48 hour(s))  SARS CORONAVIRUS 2 (TAT 6-24 HRS) Nasopharyngeal Nasopharyngeal Swab     Status: None   Collection Time: 11/15/19  1:44 PM   Specimen: Nasopharyngeal Swab  Result Value Ref Range   SARS Coronavirus 2 NEGATIVE NEGATIVE    Comment: (NOTE) SARS-CoV-2 target nucleic acids are NOT DETECTED.  The SARS-CoV-2 RNA is generally detectable in upper and lower respiratory specimens during the acute phase of infection. Negative results do not preclude SARS-CoV-2 infection, do not rule out co-infections with other pathogens, and should not be used as the sole basis for treatment or other patient management decisions. Negative results must be combined with clinical observations, patient history, and epidemiological information. The expected result is Negative.  Fact Sheet for  Patients: SugarRoll.be  Fact Sheet for Healthcare Providers: https://www.woods-mathews.com/  This test is not yet approved or cleared by the [place] FDA and  has been authorized for detection and/or diagnosis of SARS-CoV-2 by FDA under an Emergency Use Authorization (EUA). This EUA will remain  in effect (meaning this test can be used) for the duration of the COVID-19 declaration under Se ction 564(b)(1) of the Act, 21 U.S.C. section 360bbb-3(b)(1), unless the authorization is terminated or revoked sooner.  Performed at [facility], [place] [address] [place], [place] [address]     No results found.   A comprehensive review of systems was negative.  Height 5\' 8"  (1.727 m), weight 68.9 kg.  General appearance: alert, cooperative and appears stated age Head: Normocephalic, without obvious abnormality, atraumatic Neck: supple, symmetrical, trachea midline Cardio: regular rate and rhythm Resp: clear to auscultation bilaterally Extremities: Intact sensation and capillary refill all digits.  +epl/fpl/io.  Amputation left index finer through base of nail. Pulses: 2+ and symmetric Skin: Skin color, texture, turgor normal. No rashes or lesions Neurologic: Grossly normal Incision/Wound: as above  Assessment/Plan Left index finger amputation.  Non operative and operative treatment options have been discussed with the patient and patient wishes to proceed with operative treatment. Risks, benefits, and alternatives of surgery have been discussed and the patient agrees with the plan of care.   Leanora Cover 11/16/2019, 11:31 AM

## 2019-11-16 NOTE — Anesthesia Procedure Notes (Signed)
Procedure Name: LMA Insertion Date/Time: 11/16/2019 3:19 PM Performed by: Verita Lamb, CRNA Pre-anesthesia Checklist: Patient identified, Emergency Drugs available, Suction available and Patient being monitored Patient Re-evaluated:Patient Re-evaluated prior to induction Oxygen Delivery Method: Circle system utilized Preoxygenation: Pre-oxygenation with 100% oxygen Induction Type: IV induction Ventilation: Mask ventilation without difficulty LMA: LMA inserted LMA Size: 5.0 Number of attempts: 1 Placement Confirmation: positive ETCO2 and breath sounds checked- equal and bilateral Tube secured with: Tape Dental Injury: Teeth and Oropharynx as per pre-operative assessment

## 2019-11-16 NOTE — Anesthesia Preprocedure Evaluation (Signed)
Anesthesia Evaluation  Patient identified by MRN, date of birth, ID band Patient awake    Reviewed: Allergy & Precautions, NPO status , Patient's Chart, lab work & pertinent test results  History of Anesthesia Complications (+) history of anesthetic complications  Airway Mallampati: II  TM Distance: >3 FB Neck ROM: Full    Dental  (+) Edentulous Upper, Edentulous Lower, Dental Advisory Given   Pulmonary neg shortness of breath, neg sleep apnea, neg COPD, neg recent URI, Current Smoker and Patient abstained from smoking.,    breath sounds clear to auscultation       Cardiovascular hypertension, Pt. on medications (-) angina(-) Past MI and (-) CHF  Rhythm:Regular     Neuro/Psych neg Seizures PSYCHIATRIC DISORDERS Anxiety negative neurological ROS     GI/Hepatic negative GI ROS, Neg liver ROS,   Endo/Other  negative endocrine ROS  Renal/GU negative Renal ROS     Musculoskeletal  (+) Arthritis ,   Abdominal   Peds  Hematology negative hematology ROS (+)   Anesthesia Other Findings   Reproductive/Obstetrics                             Anesthesia Physical Anesthesia Plan  ASA: II  Anesthesia Plan: General   Post-op Pain Management:    Induction: Intravenous  PONV Risk Score and Plan: 1 and Treatment may vary due to age or medical condition and Ondansetron  Airway Management Planned: LMA  Additional Equipment: None  Intra-op Plan:   Post-operative Plan: Extubation in OR  Informed Consent: I have reviewed the patients History and Physical, chart, labs and discussed the procedure including the risks, benefits and alternatives for the proposed anesthesia with the patient or authorized representative who has indicated his/her understanding and acceptance.     Dental advisory given  Plan Discussed with: CRNA and Surgeon  Anesthesia Plan Comments:         Anesthesia Quick  Evaluation

## 2019-11-16 NOTE — Op Note (Signed)
I assisted Surgeon(s) and Role:    * Leanora Cover, MD - Primary    Daryll Brod, MD - Assisting on the Procedure(s): Turner on 11/16/2019.  I provided assistance on this case as follows:setup, debridement, revision, closure and application of the dressing and splint. Electronically signed by: Daryll Brod, MD Date: 11/16/2019 Time: 3:47 PM

## 2019-11-17 ENCOUNTER — Encounter (HOSPITAL_BASED_OUTPATIENT_CLINIC_OR_DEPARTMENT_OTHER): Payer: Self-pay | Admitting: Orthopedic Surgery

## 2019-11-18 NOTE — Anesthesia Postprocedure Evaluation (Signed)
Anesthesia Post Note  Patient: Nathan Turner  Procedure(s) Performed: REVISION AMPUTATION LEFT INDEX FINGER (Left Finger)     Patient location during evaluation: PACU Anesthesia Type: General Level of consciousness: awake and alert Pain management: pain level controlled Vital Signs Assessment: post-procedure vital signs reviewed and stable Respiratory status: spontaneous breathing, nonlabored ventilation, respiratory function stable and patient connected to nasal cannula oxygen Cardiovascular status: blood pressure returned to baseline and stable Postop Assessment: no apparent nausea or vomiting Anesthetic complications: no   No complications documented.  Last Vitals:  Vitals:   11/16/19 1607 11/16/19 1626  BP:  (!) 159/73  Pulse: 52 61  Resp: 18 16  Temp:  (!) 36.4 C  SpO2: 96% 100%    Last Pain:  Vitals:   11/16/19 1626  TempSrc: Oral  PainSc: 0-No pain                 Barbarajean Kinzler

## 2019-11-19 DIAGNOSIS — I10 Essential (primary) hypertension: Secondary | ICD-10-CM | POA: Diagnosis not present

## 2019-11-19 DIAGNOSIS — E7800 Pure hypercholesterolemia, unspecified: Secondary | ICD-10-CM | POA: Diagnosis not present

## 2019-11-23 DIAGNOSIS — E7800 Pure hypercholesterolemia, unspecified: Secondary | ICD-10-CM | POA: Diagnosis not present

## 2019-11-23 DIAGNOSIS — I10 Essential (primary) hypertension: Secondary | ICD-10-CM | POA: Diagnosis not present

## 2019-11-26 DIAGNOSIS — Z7189 Other specified counseling: Secondary | ICD-10-CM | POA: Diagnosis not present

## 2019-11-26 DIAGNOSIS — F17210 Nicotine dependence, cigarettes, uncomplicated: Secondary | ICD-10-CM | POA: Diagnosis not present

## 2019-11-26 DIAGNOSIS — I10 Essential (primary) hypertension: Secondary | ICD-10-CM | POA: Diagnosis not present

## 2019-11-26 DIAGNOSIS — Z6824 Body mass index (BMI) 24.0-24.9, adult: Secondary | ICD-10-CM | POA: Diagnosis not present

## 2019-11-26 DIAGNOSIS — Z0000 Encounter for general adult medical examination without abnormal findings: Secondary | ICD-10-CM | POA: Diagnosis not present

## 2019-11-26 DIAGNOSIS — Z299 Encounter for prophylactic measures, unspecified: Secondary | ICD-10-CM | POA: Diagnosis not present

## 2019-11-26 DIAGNOSIS — E7800 Pure hypercholesterolemia, unspecified: Secondary | ICD-10-CM | POA: Diagnosis not present

## 2019-11-26 DIAGNOSIS — Z79899 Other long term (current) drug therapy: Secondary | ICD-10-CM | POA: Diagnosis not present

## 2019-11-26 DIAGNOSIS — R5383 Other fatigue: Secondary | ICD-10-CM | POA: Diagnosis not present

## 2019-12-30 DIAGNOSIS — G4700 Insomnia, unspecified: Secondary | ICD-10-CM | POA: Diagnosis not present

## 2019-12-30 DIAGNOSIS — Z299 Encounter for prophylactic measures, unspecified: Secondary | ICD-10-CM | POA: Diagnosis not present

## 2019-12-30 DIAGNOSIS — J449 Chronic obstructive pulmonary disease, unspecified: Secondary | ICD-10-CM | POA: Diagnosis not present

## 2019-12-30 DIAGNOSIS — F17210 Nicotine dependence, cigarettes, uncomplicated: Secondary | ICD-10-CM | POA: Diagnosis not present

## 2019-12-30 DIAGNOSIS — E1165 Type 2 diabetes mellitus with hyperglycemia: Secondary | ICD-10-CM | POA: Diagnosis not present

## 2020-01-20 DIAGNOSIS — I10 Essential (primary) hypertension: Secondary | ICD-10-CM | POA: Diagnosis not present

## 2020-01-20 DIAGNOSIS — E7800 Pure hypercholesterolemia, unspecified: Secondary | ICD-10-CM | POA: Diagnosis not present

## 2020-01-24 DIAGNOSIS — F17210 Nicotine dependence, cigarettes, uncomplicated: Secondary | ICD-10-CM | POA: Diagnosis not present

## 2020-01-24 DIAGNOSIS — Z299 Encounter for prophylactic measures, unspecified: Secondary | ICD-10-CM | POA: Diagnosis not present

## 2020-01-24 DIAGNOSIS — E7800 Pure hypercholesterolemia, unspecified: Secondary | ICD-10-CM | POA: Diagnosis not present

## 2020-01-24 DIAGNOSIS — I700 Atherosclerosis of aorta: Secondary | ICD-10-CM | POA: Diagnosis not present

## 2020-01-24 DIAGNOSIS — J449 Chronic obstructive pulmonary disease, unspecified: Secondary | ICD-10-CM | POA: Diagnosis not present

## 2020-01-24 DIAGNOSIS — E1165 Type 2 diabetes mellitus with hyperglycemia: Secondary | ICD-10-CM | POA: Diagnosis not present

## 2020-02-18 DIAGNOSIS — E7800 Pure hypercholesterolemia, unspecified: Secondary | ICD-10-CM | POA: Diagnosis not present

## 2020-02-18 DIAGNOSIS — I10 Essential (primary) hypertension: Secondary | ICD-10-CM | POA: Diagnosis not present

## 2020-02-21 DEATH — deceased

## 2020-03-30 DIAGNOSIS — E1165 Type 2 diabetes mellitus with hyperglycemia: Secondary | ICD-10-CM | POA: Diagnosis not present

## 2020-03-30 DIAGNOSIS — I739 Peripheral vascular disease, unspecified: Secondary | ICD-10-CM | POA: Diagnosis not present

## 2020-03-30 DIAGNOSIS — I700 Atherosclerosis of aorta: Secondary | ICD-10-CM | POA: Diagnosis not present

## 2020-03-30 DIAGNOSIS — I10 Essential (primary) hypertension: Secondary | ICD-10-CM | POA: Diagnosis not present

## 2020-03-30 DIAGNOSIS — G4700 Insomnia, unspecified: Secondary | ICD-10-CM | POA: Diagnosis not present

## 2020-03-30 DIAGNOSIS — Z299 Encounter for prophylactic measures, unspecified: Secondary | ICD-10-CM | POA: Diagnosis not present

## 2020-05-09 IMAGING — RF DG LUMBAR SPINE 2-3V
1 series · 2 of 2 positions shown · non-contrast
Comparison: None.

CLINICAL DATA: Lumbar fusion

EXAM:
LUMBAR SPINE - 2-3 VIEW; DG C-ARM 1-60 MIN

[Series 1: run · 2 of 2 slices shown]
[im 1/2]
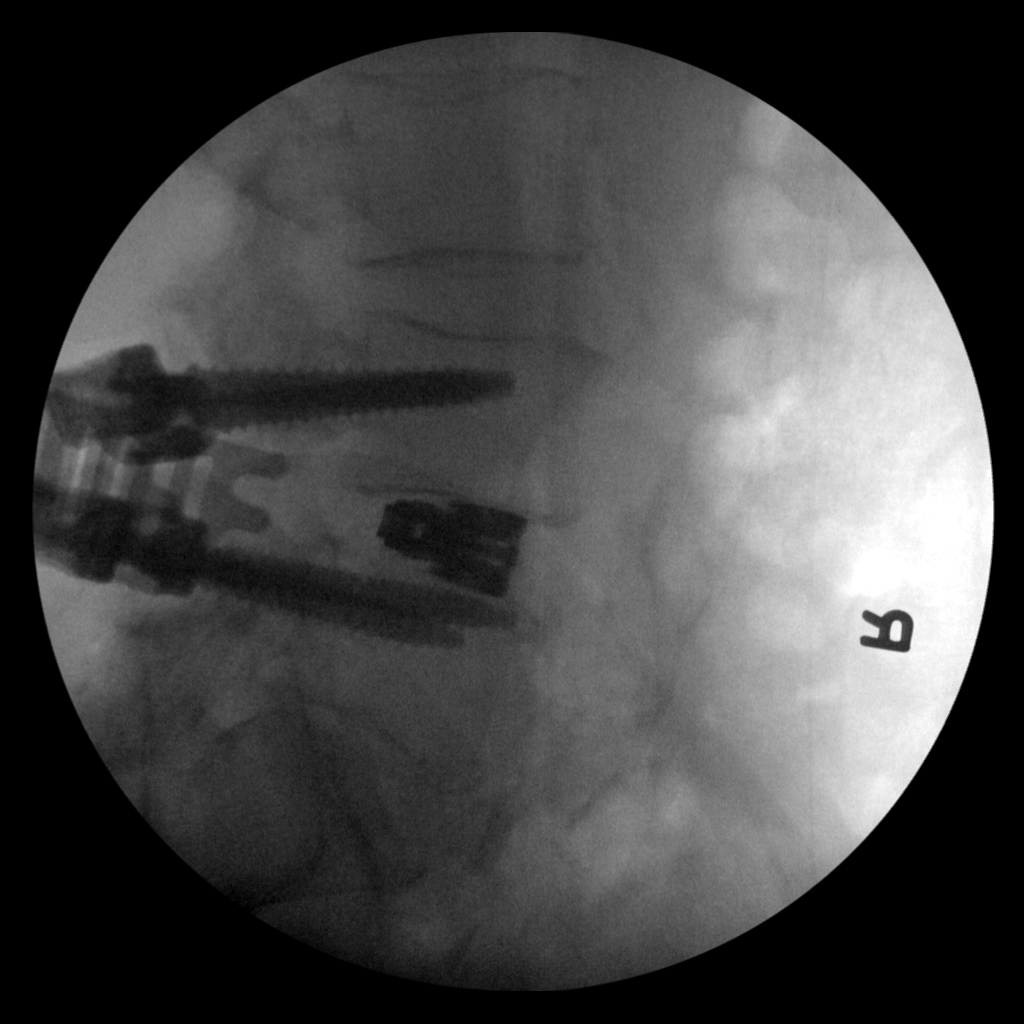
[im 2/2]
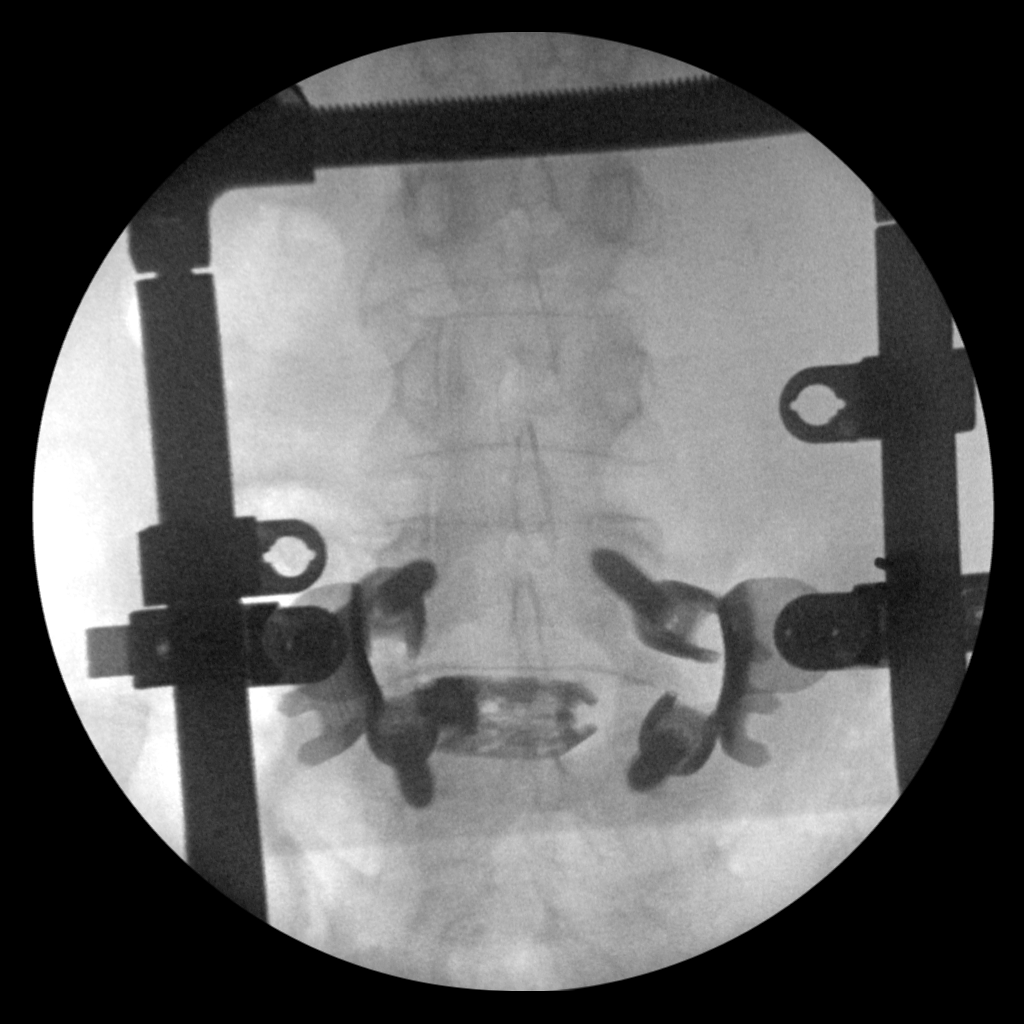

[2 of 2 positions shown; findings below may reference images not displayed]

FLUOROSCOPY TIME:  Fluoroscopy Time:  12 seconds

Radiation Exposure Index (if provided by the fluoroscopic device):
Not available

Number of Acquired Spot Images: 2
FINDINGS: Changes of interbody fusion at L4-5 are noted with pedicle screw
placement. Posterior fixation elements have not been placed.
IMPRESSION: L4-5 fusion.

## 2020-06-01 DIAGNOSIS — I739 Peripheral vascular disease, unspecified: Secondary | ICD-10-CM | POA: Diagnosis not present

## 2020-06-01 DIAGNOSIS — F17210 Nicotine dependence, cigarettes, uncomplicated: Secondary | ICD-10-CM | POA: Diagnosis not present

## 2020-06-01 DIAGNOSIS — I4891 Unspecified atrial fibrillation: Secondary | ICD-10-CM | POA: Diagnosis not present

## 2020-06-01 DIAGNOSIS — Z299 Encounter for prophylactic measures, unspecified: Secondary | ICD-10-CM | POA: Diagnosis not present

## 2020-06-01 DIAGNOSIS — Z6824 Body mass index (BMI) 24.0-24.9, adult: Secondary | ICD-10-CM | POA: Diagnosis not present

## 2020-06-01 DIAGNOSIS — E119 Type 2 diabetes mellitus without complications: Secondary | ICD-10-CM | POA: Diagnosis not present

## 2020-06-01 DIAGNOSIS — E1165 Type 2 diabetes mellitus with hyperglycemia: Secondary | ICD-10-CM | POA: Diagnosis not present

## 2020-07-06 DIAGNOSIS — M79676 Pain in unspecified toe(s): Secondary | ICD-10-CM | POA: Diagnosis not present

## 2020-07-06 DIAGNOSIS — E119 Type 2 diabetes mellitus without complications: Secondary | ICD-10-CM | POA: Diagnosis not present

## 2020-07-06 DIAGNOSIS — B351 Tinea unguium: Secondary | ICD-10-CM | POA: Diagnosis not present

## 2020-08-14 DIAGNOSIS — M545 Low back pain, unspecified: Secondary | ICD-10-CM | POA: Diagnosis not present

## 2020-08-14 DIAGNOSIS — G4700 Insomnia, unspecified: Secondary | ICD-10-CM | POA: Diagnosis not present

## 2020-08-14 DIAGNOSIS — J309 Allergic rhinitis, unspecified: Secondary | ICD-10-CM | POA: Diagnosis not present

## 2020-08-14 DIAGNOSIS — Z299 Encounter for prophylactic measures, unspecified: Secondary | ICD-10-CM | POA: Diagnosis not present

## 2020-08-14 DIAGNOSIS — E1165 Type 2 diabetes mellitus with hyperglycemia: Secondary | ICD-10-CM | POA: Diagnosis not present

## 2020-08-14 DIAGNOSIS — I10 Essential (primary) hypertension: Secondary | ICD-10-CM | POA: Diagnosis not present

## 2020-09-28 DIAGNOSIS — B351 Tinea unguium: Secondary | ICD-10-CM | POA: Diagnosis not present

## 2020-09-28 DIAGNOSIS — E119 Type 2 diabetes mellitus without complications: Secondary | ICD-10-CM | POA: Diagnosis not present

## 2020-09-28 DIAGNOSIS — L84 Corns and callosities: Secondary | ICD-10-CM | POA: Diagnosis not present

## 2020-11-28 DIAGNOSIS — Z7189 Other specified counseling: Secondary | ICD-10-CM | POA: Diagnosis not present

## 2020-11-28 DIAGNOSIS — Z0000 Encounter for general adult medical examination without abnormal findings: Secondary | ICD-10-CM | POA: Diagnosis not present

## 2020-11-28 DIAGNOSIS — E1165 Type 2 diabetes mellitus with hyperglycemia: Secondary | ICD-10-CM | POA: Diagnosis not present

## 2020-11-28 DIAGNOSIS — I10 Essential (primary) hypertension: Secondary | ICD-10-CM | POA: Diagnosis not present

## 2020-11-28 DIAGNOSIS — R5383 Other fatigue: Secondary | ICD-10-CM | POA: Diagnosis not present

## 2020-11-28 DIAGNOSIS — Z299 Encounter for prophylactic measures, unspecified: Secondary | ICD-10-CM | POA: Diagnosis not present

## 2020-11-28 DIAGNOSIS — E7800 Pure hypercholesterolemia, unspecified: Secondary | ICD-10-CM | POA: Diagnosis not present

## 2020-11-28 DIAGNOSIS — Z79899 Other long term (current) drug therapy: Secondary | ICD-10-CM | POA: Diagnosis not present

## 2020-11-28 DIAGNOSIS — F17210 Nicotine dependence, cigarettes, uncomplicated: Secondary | ICD-10-CM | POA: Diagnosis not present

## 2020-12-14 DIAGNOSIS — M79676 Pain in unspecified toe(s): Secondary | ICD-10-CM | POA: Diagnosis not present

## 2020-12-14 DIAGNOSIS — B351 Tinea unguium: Secondary | ICD-10-CM | POA: Diagnosis not present

## 2020-12-14 DIAGNOSIS — E1142 Type 2 diabetes mellitus with diabetic polyneuropathy: Secondary | ICD-10-CM | POA: Diagnosis not present

## 2020-12-14 DIAGNOSIS — L84 Corns and callosities: Secondary | ICD-10-CM | POA: Diagnosis not present

## 2020-12-18 DIAGNOSIS — I10 Essential (primary) hypertension: Secondary | ICD-10-CM | POA: Diagnosis not present

## 2020-12-18 DIAGNOSIS — M545 Low back pain, unspecified: Secondary | ICD-10-CM | POA: Diagnosis not present

## 2020-12-18 DIAGNOSIS — Z79899 Other long term (current) drug therapy: Secondary | ICD-10-CM | POA: Diagnosis not present

## 2020-12-18 DIAGNOSIS — E1165 Type 2 diabetes mellitus with hyperglycemia: Secondary | ICD-10-CM | POA: Diagnosis not present

## 2020-12-18 DIAGNOSIS — Z299 Encounter for prophylactic measures, unspecified: Secondary | ICD-10-CM | POA: Diagnosis not present

## 2021-02-15 DIAGNOSIS — H35033 Hypertensive retinopathy, bilateral: Secondary | ICD-10-CM | POA: Diagnosis not present

## 2021-03-08 DIAGNOSIS — L84 Corns and callosities: Secondary | ICD-10-CM | POA: Diagnosis not present

## 2021-03-08 DIAGNOSIS — E1142 Type 2 diabetes mellitus with diabetic polyneuropathy: Secondary | ICD-10-CM | POA: Diagnosis not present

## 2021-03-08 DIAGNOSIS — M79676 Pain in unspecified toe(s): Secondary | ICD-10-CM | POA: Diagnosis not present

## 2021-03-08 DIAGNOSIS — B351 Tinea unguium: Secondary | ICD-10-CM | POA: Diagnosis not present

## 2021-03-20 DIAGNOSIS — E1165 Type 2 diabetes mellitus with hyperglycemia: Secondary | ICD-10-CM | POA: Diagnosis not present

## 2021-03-20 DIAGNOSIS — Z299 Encounter for prophylactic measures, unspecified: Secondary | ICD-10-CM | POA: Diagnosis not present

## 2021-03-20 DIAGNOSIS — G4700 Insomnia, unspecified: Secondary | ICD-10-CM | POA: Diagnosis not present

## 2021-03-20 DIAGNOSIS — I10 Essential (primary) hypertension: Secondary | ICD-10-CM | POA: Diagnosis not present

## 2021-03-20 DIAGNOSIS — K5909 Other constipation: Secondary | ICD-10-CM | POA: Diagnosis not present

## 2021-05-22 DIAGNOSIS — I10 Essential (primary) hypertension: Secondary | ICD-10-CM | POA: Diagnosis not present

## 2021-05-22 DIAGNOSIS — E782 Mixed hyperlipidemia: Secondary | ICD-10-CM | POA: Diagnosis not present

## 2021-05-30 DIAGNOSIS — Z299 Encounter for prophylactic measures, unspecified: Secondary | ICD-10-CM | POA: Diagnosis not present

## 2021-05-30 DIAGNOSIS — I739 Peripheral vascular disease, unspecified: Secondary | ICD-10-CM | POA: Diagnosis not present

## 2021-05-30 DIAGNOSIS — I10 Essential (primary) hypertension: Secondary | ICD-10-CM | POA: Diagnosis not present

## 2021-05-30 DIAGNOSIS — F17210 Nicotine dependence, cigarettes, uncomplicated: Secondary | ICD-10-CM | POA: Diagnosis not present

## 2021-05-30 DIAGNOSIS — G2581 Restless legs syndrome: Secondary | ICD-10-CM | POA: Diagnosis not present

## 2021-05-30 DIAGNOSIS — Z6824 Body mass index (BMI) 24.0-24.9, adult: Secondary | ICD-10-CM | POA: Diagnosis not present

## 2021-05-30 DIAGNOSIS — M2550 Pain in unspecified joint: Secondary | ICD-10-CM | POA: Diagnosis not present

## 2021-08-02 DIAGNOSIS — Z299 Encounter for prophylactic measures, unspecified: Secondary | ICD-10-CM | POA: Diagnosis not present

## 2021-08-02 DIAGNOSIS — F17210 Nicotine dependence, cigarettes, uncomplicated: Secondary | ICD-10-CM | POA: Diagnosis not present

## 2021-08-02 DIAGNOSIS — E1165 Type 2 diabetes mellitus with hyperglycemia: Secondary | ICD-10-CM | POA: Diagnosis not present

## 2021-08-02 DIAGNOSIS — I10 Essential (primary) hypertension: Secondary | ICD-10-CM | POA: Diagnosis not present

## 2021-08-02 DIAGNOSIS — M545 Low back pain, unspecified: Secondary | ICD-10-CM | POA: Diagnosis not present

## 2021-08-23 DIAGNOSIS — M79676 Pain in unspecified toe(s): Secondary | ICD-10-CM | POA: Diagnosis not present

## 2021-08-23 DIAGNOSIS — B351 Tinea unguium: Secondary | ICD-10-CM | POA: Diagnosis not present

## 2021-08-23 DIAGNOSIS — L84 Corns and callosities: Secondary | ICD-10-CM | POA: Diagnosis not present

## 2021-08-23 DIAGNOSIS — E1142 Type 2 diabetes mellitus with diabetic polyneuropathy: Secondary | ICD-10-CM | POA: Diagnosis not present

## 2021-09-10 DIAGNOSIS — M545 Low back pain, unspecified: Secondary | ICD-10-CM | POA: Diagnosis not present

## 2021-09-10 DIAGNOSIS — I10 Essential (primary) hypertension: Secondary | ICD-10-CM | POA: Diagnosis not present

## 2021-09-10 DIAGNOSIS — Z299 Encounter for prophylactic measures, unspecified: Secondary | ICD-10-CM | POA: Diagnosis not present

## 2021-09-10 DIAGNOSIS — G4700 Insomnia, unspecified: Secondary | ICD-10-CM | POA: Diagnosis not present

## 2021-09-10 DIAGNOSIS — Z6824 Body mass index (BMI) 24.0-24.9, adult: Secondary | ICD-10-CM | POA: Diagnosis not present

## 2021-09-10 DIAGNOSIS — E1165 Type 2 diabetes mellitus with hyperglycemia: Secondary | ICD-10-CM | POA: Diagnosis not present

## 2021-09-10 DIAGNOSIS — F17210 Nicotine dependence, cigarettes, uncomplicated: Secondary | ICD-10-CM | POA: Diagnosis not present

## 2021-10-19 DIAGNOSIS — I10 Essential (primary) hypertension: Secondary | ICD-10-CM | POA: Diagnosis not present

## 2021-10-19 DIAGNOSIS — E782 Mixed hyperlipidemia: Secondary | ICD-10-CM | POA: Diagnosis not present

## 2021-11-01 DIAGNOSIS — M79676 Pain in unspecified toe(s): Secondary | ICD-10-CM | POA: Diagnosis not present

## 2021-11-01 DIAGNOSIS — B351 Tinea unguium: Secondary | ICD-10-CM | POA: Diagnosis not present

## 2021-11-01 DIAGNOSIS — E1142 Type 2 diabetes mellitus with diabetic polyneuropathy: Secondary | ICD-10-CM | POA: Diagnosis not present

## 2021-11-01 DIAGNOSIS — L84 Corns and callosities: Secondary | ICD-10-CM | POA: Diagnosis not present

## 2021-12-03 DIAGNOSIS — Z6824 Body mass index (BMI) 24.0-24.9, adult: Secondary | ICD-10-CM | POA: Diagnosis not present

## 2021-12-03 DIAGNOSIS — I4891 Unspecified atrial fibrillation: Secondary | ICD-10-CM | POA: Diagnosis not present

## 2021-12-03 DIAGNOSIS — R5383 Other fatigue: Secondary | ICD-10-CM | POA: Diagnosis not present

## 2021-12-03 DIAGNOSIS — Z79899 Other long term (current) drug therapy: Secondary | ICD-10-CM | POA: Diagnosis not present

## 2021-12-03 DIAGNOSIS — I10 Essential (primary) hypertension: Secondary | ICD-10-CM | POA: Diagnosis not present

## 2021-12-03 DIAGNOSIS — Z7189 Other specified counseling: Secondary | ICD-10-CM | POA: Diagnosis not present

## 2021-12-03 DIAGNOSIS — Z299 Encounter for prophylactic measures, unspecified: Secondary | ICD-10-CM | POA: Diagnosis not present

## 2021-12-03 DIAGNOSIS — Z0000 Encounter for general adult medical examination without abnormal findings: Secondary | ICD-10-CM | POA: Diagnosis not present

## 2021-12-03 DIAGNOSIS — R64 Cachexia: Secondary | ICD-10-CM | POA: Diagnosis not present

## 2021-12-03 DIAGNOSIS — E7800 Pure hypercholesterolemia, unspecified: Secondary | ICD-10-CM | POA: Diagnosis not present

## 2021-12-03 DIAGNOSIS — F17210 Nicotine dependence, cigarettes, uncomplicated: Secondary | ICD-10-CM | POA: Diagnosis not present

## 2021-12-14 ENCOUNTER — Inpatient Hospital Stay (HOSPITAL_COMMUNITY)
Admission: EM | Admit: 2021-12-14 | Discharge: 2021-12-20 | DRG: 177 | Disposition: A | Discharge status: Home Health | Payer: Medicare Other | Attending: Internal Medicine | Admitting: Internal Medicine

## 2021-12-14 ENCOUNTER — Emergency Department (HOSPITAL_COMMUNITY): Payer: Medicare Other

## 2021-12-14 DIAGNOSIS — W19XXXA Unspecified fall, initial encounter: Secondary | ICD-10-CM | POA: Diagnosis not present

## 2021-12-14 DIAGNOSIS — Y92009 Unspecified place in unspecified non-institutional (private) residence as the place of occurrence of the external cause: Secondary | ICD-10-CM | POA: Diagnosis not present

## 2021-12-14 DIAGNOSIS — J9602 Acute respiratory failure with hypercapnia: Secondary | ICD-10-CM | POA: Diagnosis not present

## 2021-12-14 DIAGNOSIS — C3431 Malignant neoplasm of lower lobe, right bronchus or lung: Secondary | ICD-10-CM | POA: Diagnosis present

## 2021-12-14 DIAGNOSIS — R0902 Hypoxemia: Secondary | ICD-10-CM

## 2021-12-14 DIAGNOSIS — E1165 Type 2 diabetes mellitus with hyperglycemia: Secondary | ICD-10-CM | POA: Diagnosis present

## 2021-12-14 DIAGNOSIS — I7123 Aneurysm of the descending thoracic aorta, without rupture: Secondary | ICD-10-CM | POA: Diagnosis present

## 2021-12-14 DIAGNOSIS — I6529 Occlusion and stenosis of unspecified carotid artery: Secondary | ICD-10-CM | POA: Diagnosis not present

## 2021-12-14 DIAGNOSIS — E782 Mixed hyperlipidemia: Secondary | ICD-10-CM | POA: Diagnosis present

## 2021-12-14 DIAGNOSIS — E441 Mild protein-calorie malnutrition: Secondary | ICD-10-CM | POA: Diagnosis not present

## 2021-12-14 DIAGNOSIS — J159 Unspecified bacterial pneumonia: Secondary | ICD-10-CM | POA: Diagnosis present

## 2021-12-14 DIAGNOSIS — J9601 Acute respiratory failure with hypoxia: Secondary | ICD-10-CM | POA: Diagnosis present

## 2021-12-14 DIAGNOSIS — F419 Anxiety disorder, unspecified: Secondary | ICD-10-CM | POA: Diagnosis present

## 2021-12-14 DIAGNOSIS — R0602 Shortness of breath: Secondary | ICD-10-CM | POA: Diagnosis not present

## 2021-12-14 DIAGNOSIS — Z6824 Body mass index (BMI) 24.0-24.9, adult: Secondary | ICD-10-CM | POA: Diagnosis not present

## 2021-12-14 DIAGNOSIS — Z79899 Other long term (current) drug therapy: Secondary | ICD-10-CM

## 2021-12-14 DIAGNOSIS — R609 Edema, unspecified: Secondary | ICD-10-CM | POA: Diagnosis not present

## 2021-12-14 DIAGNOSIS — W010XXA Fall on same level from slipping, tripping and stumbling without subsequent striking against object, initial encounter: Secondary | ICD-10-CM | POA: Diagnosis present

## 2021-12-14 DIAGNOSIS — Z89022 Acquired absence of left finger(s): Secondary | ICD-10-CM | POA: Diagnosis not present

## 2021-12-14 DIAGNOSIS — J439 Emphysema, unspecified: Secondary | ICD-10-CM | POA: Diagnosis present

## 2021-12-14 DIAGNOSIS — E8809 Other disorders of plasma-protein metabolism, not elsewhere classified: Secondary | ICD-10-CM | POA: Diagnosis present

## 2021-12-14 DIAGNOSIS — R590 Localized enlarged lymph nodes: Secondary | ICD-10-CM | POA: Diagnosis not present

## 2021-12-14 DIAGNOSIS — R7989 Other specified abnormal findings of blood chemistry: Secondary | ICD-10-CM | POA: Diagnosis not present

## 2021-12-14 DIAGNOSIS — R918 Other nonspecific abnormal finding of lung field: Secondary | ICD-10-CM

## 2021-12-14 DIAGNOSIS — I5031 Acute diastolic (congestive) heart failure: Secondary | ICD-10-CM | POA: Diagnosis not present

## 2021-12-14 DIAGNOSIS — D72829 Elevated white blood cell count, unspecified: Secondary | ICD-10-CM

## 2021-12-14 DIAGNOSIS — D649 Anemia, unspecified: Secondary | ICD-10-CM | POA: Diagnosis not present

## 2021-12-14 DIAGNOSIS — R4182 Altered mental status, unspecified: Secondary | ICD-10-CM

## 2021-12-14 DIAGNOSIS — J441 Chronic obstructive pulmonary disease with (acute) exacerbation: Secondary | ICD-10-CM | POA: Diagnosis not present

## 2021-12-14 DIAGNOSIS — E46 Unspecified protein-calorie malnutrition: Secondary | ICD-10-CM

## 2021-12-14 DIAGNOSIS — G4700 Insomnia, unspecified: Secondary | ICD-10-CM | POA: Diagnosis not present

## 2021-12-14 DIAGNOSIS — Z7982 Long term (current) use of aspirin: Secondary | ICD-10-CM | POA: Diagnosis not present

## 2021-12-14 DIAGNOSIS — G9341 Metabolic encephalopathy: Secondary | ICD-10-CM | POA: Diagnosis not present

## 2021-12-14 DIAGNOSIS — G319 Degenerative disease of nervous system, unspecified: Secondary | ICD-10-CM | POA: Diagnosis not present

## 2021-12-14 DIAGNOSIS — Z720 Tobacco use: Secondary | ICD-10-CM

## 2021-12-14 DIAGNOSIS — I10 Essential (primary) hypertension: Secondary | ICD-10-CM | POA: Diagnosis present

## 2021-12-14 DIAGNOSIS — K219 Gastro-esophageal reflux disease without esophagitis: Secondary | ICD-10-CM | POA: Diagnosis present

## 2021-12-14 DIAGNOSIS — I719 Aortic aneurysm of unspecified site, without rupture: Secondary | ICD-10-CM

## 2021-12-14 DIAGNOSIS — R0609 Other forms of dyspnea: Secondary | ICD-10-CM | POA: Diagnosis not present

## 2021-12-14 DIAGNOSIS — S0990XA Unspecified injury of head, initial encounter: Secondary | ICD-10-CM | POA: Diagnosis not present

## 2021-12-14 DIAGNOSIS — F17290 Nicotine dependence, other tobacco product, uncomplicated: Secondary | ICD-10-CM | POA: Diagnosis not present

## 2021-12-14 DIAGNOSIS — U071 COVID-19: Secondary | ICD-10-CM | POA: Diagnosis not present

## 2021-12-14 DIAGNOSIS — J811 Chronic pulmonary edema: Secondary | ICD-10-CM | POA: Diagnosis not present

## 2021-12-14 LAB — CBC
HCT: 36.9 % — ABNORMAL LOW (ref 39.0–52.0)
Hemoglobin: 12.1 g/dL — ABNORMAL LOW (ref 13.0–17.0)
MCH: 29.2 pg (ref 26.0–34.0)
MCHC: 32.8 g/dL (ref 30.0–36.0)
MCV: 88.9 fL (ref 80.0–100.0)
Platelets: 244 10*3/uL (ref 150–400)
RBC: 4.15 MIL/uL — ABNORMAL LOW (ref 4.22–5.81)
RDW: 13.8 % (ref 11.5–15.5)
WBC: 12.4 10*3/uL — ABNORMAL HIGH (ref 4.0–10.5)
nRBC: 0 % (ref 0.0–0.2)

## 2021-12-14 LAB — CBG MONITORING, ED: Glucose-Capillary: 202 mg/dL — ABNORMAL HIGH (ref 70–99)

## 2021-12-14 LAB — COMPREHENSIVE METABOLIC PANEL
ALT: 50 U/L — ABNORMAL HIGH (ref 0–44)
AST: 30 U/L (ref 15–41)
Albumin: 3.4 g/dL — ABNORMAL LOW (ref 3.5–5.0)
Alkaline Phosphatase: 87 U/L (ref 38–126)
Anion gap: 11 (ref 5–15)
BUN: 43 mg/dL — ABNORMAL HIGH (ref 8–23)
CO2: 27 mmol/L (ref 22–32)
Calcium: 8.8 mg/dL — ABNORMAL LOW (ref 8.9–10.3)
Chloride: 101 mmol/L (ref 98–111)
Creatinine, Ser: 1.2 mg/dL (ref 0.61–1.24)
GFR, Estimated: >60 mL/min (ref >60)
Glucose, Bld: 188 mg/dL — ABNORMAL HIGH (ref 70–99)
Potassium: 4.5 mmol/L (ref 3.5–5.1)
Sodium: 139 mmol/L (ref 135–145)
Total Bilirubin: 1 mg/dL (ref 0.3–1.2)
Total Protein: 7.3 g/dL (ref 6.5–8.1)

## 2021-12-14 MED ORDER — FUROSEMIDE 10 MG/ML IJ SOLN
20.0000 mg | Freq: Once | INTRAMUSCULAR | Status: AC
  Administered 2021-12-15: 20 mg via INTRAVENOUS
  Filled 2021-12-14: qty 2

## 2021-12-14 NOTE — ED Provider Notes (Addendum)
[facility] EMERGENCY DEPARTMENT Provider Note   CSN: [id number] Arrival date & time: 12/14/21  1819     History  Chief Complaint  Patient presents with   Fall   Altered Mental Status    Nathan Turner is a 76 y.o. male.  Patient brought in by [facility].  The only family that he has.  Patient unfortunately lost his wife about 3 weeks ago patient was left at home while the stepson went to take care of some of the affairs regarding property that the wound related to the patient and his wife.  Patient states that he fell 2 days ago he was home by himself for 2 days had mechanical fall tripped over the dog.  No loss of consciousness.  But did hit his head.  Does take an aspirin daily.  Patient stepson who brought amends of patient on the phone yesterday around 4 PM talk to him and there was no concern patient seem to be acting his normal self.  Patient stated that today he was not acting his normal self.  Patient answering orientation question appropriately at triage.  Patient's oxygen sats off of oxygen and he is not normally on oxygen is 85%.  He does not have oxygen at home.  Medications at home include the aspirin Requip and Restoril Celexa.  Past medical history significant for hypertension anxiety prediabetes.  Past surgical history just, amputation left index finger.  Patient is an every day smoker still smokes 1 pack a day been doing that for 64 years.       Home Medications Prior to Admission medications   Medication Sig Start Date End Date Taking? Authorizing Provider  aspirin EC 81 MG tablet Take 81 mg by mouth every other day.     [provider]  cephALEXin (KEFLEX) 500 MG capsule Take 500 mg by mouth 4 (four) times daily.    [provider]  citalopram (CELEXA) 20 MG tablet Take 20 mg by mouth daily.    [provider]  diltiazem (CARTIA XT) 120 MG 24 hr capsule Take 120 mg by mouth daily.    [provider]  lovastatin (MEVACOR) 20 MG  tablet Take 20 mg by mouth daily.     [provider]  Omega-3 Fatty Acids (FISH OIL) 1200 MG CAPS Take 1,200 mg by mouth daily.    [provider]  rOPINIRole (REQUIP) 1 MG tablet Take 1 mg by mouth at bedtime.    [provider]  temazepam (RESTORIL) 15 MG capsule Take 15 mg by mouth at bedtime.     [provider]  traMADol (ULTRAM) 50 MG tablet Take by mouth every 6 (six) hours as needed.    [provider]      Allergies    Patient has no known allergies.    Review of Systems   Review of Systems  Constitutional:  Negative for chills and fever.  HENT:  Negative for ear pain and sore throat.   Eyes:  Negative for pain and visual disturbance.  Respiratory:  Negative for cough and shortness of breath.   Cardiovascular:  Negative for chest pain, palpitations and leg swelling.  Gastrointestinal:  Negative for abdominal pain and vomiting.  Genitourinary:  Negative for dysuria and hematuria.  Musculoskeletal:  Negative for arthralgias and back pain.  Skin:  Negative for color change and rash.  Neurological:  Negative for seizures and syncope.  Psychiatric/Behavioral:  Positive for confusion.   All other systems reviewed and  are negative.   Physical Exam Updated Vital Signs BP 135/79   Pulse 74   Temp 97.8 F (36.6 C) (Oral)   Resp (!) 21   Ht 1.6 m (5\' 3" )   Wt 65.3 kg   SpO2 91%   BMI 25.51 kg/m  Physical Exam Vitals and nursing note reviewed.  Constitutional:      General: He is not in acute distress.    Appearance: Normal appearance. He is well-developed.  HENT:     Head: Normocephalic.     Comments: Patient's right upper forehead area with an abrasion.  No active bleeding.    Mouth/Throat:     Mouth: Mucous membranes are dry.  Eyes:     Extraocular Movements: Extraocular movements intact.     Conjunctiva/sclera: Conjunctivae normal.     Pupils: Pupils are equal, round, and reactive to light.  Cardiovascular:     Rate  and Rhythm: Normal rate and regular rhythm.     Heart sounds: No murmur heard. Pulmonary:     Effort: Pulmonary effort is normal. No respiratory distress.     Breath sounds: Normal breath sounds. No wheezing or rales.  Abdominal:     Palpations: Abdomen is soft.     Tenderness: There is no abdominal tenderness.  Musculoskeletal:        General: No swelling.     Cervical back: Neck supple. No rigidity or tenderness.     Right lower leg: No edema.     Left lower leg: No edema.  Skin:    General: Skin is warm and dry.     Capillary Refill: Capillary refill takes less than 2 seconds.  Neurological:     General: No focal deficit present.     Mental Status: He is alert and oriented to person, place, and time.     Cranial Nerves: No cranial nerve deficit.     Sensory: No sensory deficit.     Motor: No weakness.     Comments: Patient here fairly oriented.  Follows commands relates to the death of his wife.  That he was home by himself for 2 days.  So he seems fairly oriented.  Psychiatric:        Mood and Affect: Mood normal.     ED Results / Procedures / Treatments   Labs (all labs ordered are listed, but only abnormal results are displayed) Labs Reviewed  COMPREHENSIVE METABOLIC PANEL - Abnormal; Notable for the following components:      Result Value   Glucose, Bld 188 (*)    BUN 43 (*)    Calcium 8.8 (*)    Albumin 3.4 (*)    ALT 50 (*)    All other components within normal limits  CBC - Abnormal; Notable for the following components:   WBC 12.4 (*)    RBC 4.15 (*)    Hemoglobin 12.1 (*)    HCT 36.9 (*)    All other components within normal limits  CBG MONITORING, ED - Abnormal; Notable for the following components:   Glucose-Capillary 202 (*)    All other components within normal limits    EKG EKG Interpretation  Date/Time:  Friday December 14 2021 18:32:03 EDT Ventricular Rate:  77 PR Interval:  160 QRS Duration: 144 QT Interval:  388 QTC Calculation: 439 R  Axis:   57 Text Interpretation: Normal sinus rhythm Right bundle branch block Abnormal ECG When compared with ECG of 18-May-2019 12:03, No significant change was found Confirmed by Rogene Houston,  Huie Ghuman [phone]) on 12/14/2021 10:24:50 PM  Radiology DG Chest Portable 1 View  Result Date: 12/14/2021 CLINICAL DATA:  New oxygen requirement EXAM: PORTABLE CHEST 1 VIEW COMPARISON:  None Available. FINDINGS: Diffuse mild interstitial opacity. Normal cardiomediastinal contours. No pleural effusion or pneumothorax. IMPRESSION: Mild interstitial pulmonary edema. Electronically Signed   By: Ulyses Jarred M.D.   On: 12/14/2021 22:43    Procedures Procedures    Medications Ordered in ED Medications - No data to display  ED Course/ Medical Decision Making/ A&P                           Medical Decision Making Amount and/or Complexity of Data Reviewed Labs: ordered. Radiology: ordered.  Risk Decision regarding hospitalization.   Patient room air oxygen sats will drop down to the mid 80s.  On 2 L of oxygen his oxygen sats are in the low 90s.  Patient denies any shortness of breath lungs are clear no leg swelling.  Chest x-ray concerning for maybe some mild pulmonary edema but no evidence of any pneumothorax or any significant pneumonia.  Blood sugar elevated little bit at 202 white count up a little bit at 12.4 hemoglobin 12.1.  Complete metabolic panel renal function GFR greater and 60.  Severity normal LFTs are normal.  Potassium normal.  Chest x-ray as mentioned shows mild or additional pulmonary edema.  EKG without any acute changes.  Patient denies any chest pain.  Due to the fall and the abrasion on the head patient will get CT head.  Patient most likely has undiagnosed emphysema COPD which may explain the oxygen requirement.  Based on this oxygen requirement patient will require admission.  Just recheck patient's lungs.  They are clear.  Patient not febrile.  However patient does at times seem to be a  little confused.  Which is different than what are reported above.  But he will will follow commands.  Once we know head CT results I will contact hospitalist for admission.  Patient oxygen sats were 94%.  Patient currently on 3 L of oxygen.  Final Clinical Impression(s) / ED Diagnoses Final diagnoses:  Fall, initial encounter  Hypoxia    Rx / DC Orders ED Discharge Orders     None         Fredia Sorrow, MD 12/14/21 2312    Fredia Sorrow, MD 12/14/21 6546    Fredia Sorrow, MD 12/14/21 2317

## 2021-12-14 NOTE — ED Triage Notes (Addendum)
Pt to ED via POV with stepson c/o AMS. Pt fell 2 days ago, mechanical fall, tripped over dog. No LOC, Reports takes 81mg  Aspirin QOD. Pt stepson reports talked to pt on phone yesterday around 4pm and there was no concern/ pt acting normal self. This evening per step son, pt "not acting himself" Also reports today is 1 month anniversary of wife death . Pt answering orientation questions appropriately in triage.

## 2021-12-14 NOTE — ED Notes (Signed)
Pt o2 sats noted to be 85% in triage, placed on [employer] 6L, Now 92%, Pt stepson now reports he is concerned pt may be taking more of his home medication than prescribed, but unknown what he takes.

## 2021-12-15 ENCOUNTER — Other Ambulatory Visit: Payer: Self-pay

## 2021-12-15 ENCOUNTER — Observation Stay (HOSPITAL_COMMUNITY): Payer: Medicare Other

## 2021-12-15 ENCOUNTER — Encounter (HOSPITAL_COMMUNITY): Payer: Self-pay | Admitting: Internal Medicine

## 2021-12-15 DIAGNOSIS — R4182 Altered mental status, unspecified: Secondary | ICD-10-CM | POA: Diagnosis not present

## 2021-12-15 DIAGNOSIS — I7123 Aneurysm of the descending thoracic aorta, without rupture: Secondary | ICD-10-CM | POA: Diagnosis present

## 2021-12-15 DIAGNOSIS — G9341 Metabolic encephalopathy: Secondary | ICD-10-CM | POA: Diagnosis present

## 2021-12-15 DIAGNOSIS — W19XXXA Unspecified fall, initial encounter: Secondary | ICD-10-CM

## 2021-12-15 DIAGNOSIS — Z7982 Long term (current) use of aspirin: Secondary | ICD-10-CM | POA: Diagnosis not present

## 2021-12-15 DIAGNOSIS — C3431 Malignant neoplasm of lower lobe, right bronchus or lung: Secondary | ICD-10-CM | POA: Diagnosis present

## 2021-12-15 DIAGNOSIS — Z89022 Acquired absence of left finger(s): Secondary | ICD-10-CM | POA: Diagnosis not present

## 2021-12-15 DIAGNOSIS — R609 Edema, unspecified: Secondary | ICD-10-CM

## 2021-12-15 DIAGNOSIS — G4700 Insomnia, unspecified: Secondary | ICD-10-CM | POA: Diagnosis present

## 2021-12-15 DIAGNOSIS — D72829 Elevated white blood cell count, unspecified: Secondary | ICD-10-CM

## 2021-12-15 DIAGNOSIS — Z6824 Body mass index (BMI) 24.0-24.9, adult: Secondary | ICD-10-CM | POA: Diagnosis not present

## 2021-12-15 DIAGNOSIS — J9601 Acute respiratory failure with hypoxia: Secondary | ICD-10-CM | POA: Diagnosis present

## 2021-12-15 DIAGNOSIS — J159 Unspecified bacterial pneumonia: Secondary | ICD-10-CM | POA: Diagnosis present

## 2021-12-15 DIAGNOSIS — W010XXA Fall on same level from slipping, tripping and stumbling without subsequent striking against object, initial encounter: Secondary | ICD-10-CM | POA: Diagnosis present

## 2021-12-15 DIAGNOSIS — J439 Emphysema, unspecified: Secondary | ICD-10-CM | POA: Diagnosis present

## 2021-12-15 DIAGNOSIS — Z720 Tobacco use: Secondary | ICD-10-CM

## 2021-12-15 DIAGNOSIS — E8809 Other disorders of plasma-protein metabolism, not elsewhere classified: Secondary | ICD-10-CM | POA: Diagnosis present

## 2021-12-15 DIAGNOSIS — J9602 Acute respiratory failure with hypercapnia: Secondary | ICD-10-CM | POA: Diagnosis present

## 2021-12-15 DIAGNOSIS — I10 Essential (primary) hypertension: Secondary | ICD-10-CM

## 2021-12-15 DIAGNOSIS — U071 COVID-19: Secondary | ICD-10-CM | POA: Diagnosis present

## 2021-12-15 DIAGNOSIS — I719 Aortic aneurysm of unspecified site, without rupture: Secondary | ICD-10-CM

## 2021-12-15 DIAGNOSIS — I5031 Acute diastolic (congestive) heart failure: Secondary | ICD-10-CM | POA: Diagnosis present

## 2021-12-15 DIAGNOSIS — Y92009 Unspecified place in unspecified non-institutional (private) residence as the place of occurrence of the external cause: Secondary | ICD-10-CM | POA: Diagnosis not present

## 2021-12-15 DIAGNOSIS — E441 Mild protein-calorie malnutrition: Secondary | ICD-10-CM | POA: Diagnosis present

## 2021-12-15 DIAGNOSIS — F17290 Nicotine dependence, other tobacco product, uncomplicated: Secondary | ICD-10-CM | POA: Diagnosis present

## 2021-12-15 DIAGNOSIS — F419 Anxiety disorder, unspecified: Secondary | ICD-10-CM | POA: Diagnosis present

## 2021-12-15 DIAGNOSIS — R918 Other nonspecific abnormal finding of lung field: Secondary | ICD-10-CM

## 2021-12-15 DIAGNOSIS — E782 Mixed hyperlipidemia: Secondary | ICD-10-CM

## 2021-12-15 DIAGNOSIS — E46 Unspecified protein-calorie malnutrition: Secondary | ICD-10-CM

## 2021-12-15 DIAGNOSIS — R590 Localized enlarged lymph nodes: Secondary | ICD-10-CM | POA: Diagnosis present

## 2021-12-15 DIAGNOSIS — R0902 Hypoxemia: Secondary | ICD-10-CM | POA: Diagnosis present

## 2021-12-15 DIAGNOSIS — E1165 Type 2 diabetes mellitus with hyperglycemia: Secondary | ICD-10-CM | POA: Diagnosis not present

## 2021-12-15 DIAGNOSIS — R7989 Other specified abnormal findings of blood chemistry: Secondary | ICD-10-CM

## 2021-12-15 DIAGNOSIS — J441 Chronic obstructive pulmonary disease with (acute) exacerbation: Secondary | ICD-10-CM | POA: Diagnosis not present

## 2021-12-15 DIAGNOSIS — D649 Anemia, unspecified: Secondary | ICD-10-CM | POA: Diagnosis present

## 2021-12-15 DIAGNOSIS — R0609 Other forms of dyspnea: Secondary | ICD-10-CM | POA: Diagnosis not present

## 2021-12-15 DIAGNOSIS — Z79899 Other long term (current) drug therapy: Secondary | ICD-10-CM | POA: Diagnosis not present

## 2021-12-15 LAB — CBC
HCT: 36.2 % — ABNORMAL LOW (ref 39.0–52.0)
Hemoglobin: 12.2 g/dL — ABNORMAL LOW (ref 13.0–17.0)
MCH: 29.5 pg (ref 26.0–34.0)
MCHC: 33.7 g/dL (ref 30.0–36.0)
MCV: 87.7 fL (ref 80.0–100.0)
Platelets: 249 10*3/uL (ref 150–400)
RBC: 4.13 MIL/uL — ABNORMAL LOW (ref 4.22–5.81)
RDW: 13.9 % (ref 11.5–15.5)
WBC: 11.2 10*3/uL — ABNORMAL HIGH (ref 4.0–10.5)
nRBC: 0 % (ref 0.0–0.2)

## 2021-12-15 LAB — GLUCOSE, CAPILLARY: Glucose-Capillary: 161 mg/dL — ABNORMAL HIGH (ref 70–99)

## 2021-12-15 LAB — COMPREHENSIVE METABOLIC PANEL
ALT: 50 U/L — ABNORMAL HIGH (ref 0–44)
AST: 36 U/L (ref 15–41)
Albumin: 2.9 g/dL — ABNORMAL LOW (ref 3.5–5.0)
Alkaline Phosphatase: 86 U/L (ref 38–126)
Anion gap: 7 (ref 5–15)
BUN: 32 mg/dL — ABNORMAL HIGH (ref 8–23)
CO2: 32 mmol/L (ref 22–32)
Calcium: 8.6 mg/dL — ABNORMAL LOW (ref 8.9–10.3)
Chloride: 101 mmol/L (ref 98–111)
Creatinine, Ser: 0.89 mg/dL (ref 0.61–1.24)
GFR, Estimated: >60 mL/min (ref >60)
Glucose, Bld: 152 mg/dL — ABNORMAL HIGH (ref 70–99)
Potassium: 3.9 mmol/L (ref 3.5–5.1)
Sodium: 140 mmol/L (ref 135–145)
Total Bilirubin: 0.9 mg/dL (ref 0.3–1.2)
Total Protein: 6.5 g/dL (ref 6.5–8.1)

## 2021-12-15 LAB — HEMOGLOBIN A1C
Hgb A1c MFr Bld: 6.4 % — ABNORMAL HIGH (ref 4.8–5.6)
Mean Plasma Glucose: 136.98 mg/dL

## 2021-12-15 LAB — D-DIMER, QUANTITATIVE: D-Dimer, Quant: 1.34 ug/mL-FEU — ABNORMAL HIGH (ref 0.00–0.50)

## 2021-12-15 LAB — CBG MONITORING, ED
Glucose-Capillary: 138 mg/dL — ABNORMAL HIGH (ref 70–99)
Glucose-Capillary: 170 mg/dL — ABNORMAL HIGH (ref 70–99)
Glucose-Capillary: 214 mg/dL — ABNORMAL HIGH (ref 70–99)
Glucose-Capillary: 349 mg/dL — ABNORMAL HIGH (ref 70–99)

## 2021-12-15 LAB — BRAIN NATRIURETIC PEPTIDE: B Natriuretic Peptide: 296 pg/mL — ABNORMAL HIGH (ref 0.0–100.0)

## 2021-12-15 LAB — APTT: aPTT: 30 seconds (ref 24–36)

## 2021-12-15 MED ORDER — HALOPERIDOL LACTATE 5 MG/ML IJ SOLN: 2.0000 mg | Freq: Four times a day (QID) | INTRAMUSCULAR | Status: DC | PRN

## 2021-12-15 MED ORDER — TEMAZEPAM 15 MG PO CAPS
15.0000 mg | ORAL_CAPSULE | Freq: Every day | ORAL | Status: DC
  Administered 2021-12-15 – 2021-12-19 (×5): 15 mg via ORAL
  Filled 2021-12-15 (×5): qty 1

## 2021-12-15 MED ORDER — ACETAMINOPHEN 650 MG RE SUPP: 650.0000 mg | Freq: Four times a day (QID) | RECTAL | Status: DC | PRN

## 2021-12-15 MED ORDER — BUDESONIDE 0.25 MG/2ML IN SUSP
0.2500 mg | Freq: Two times a day (BID) | RESPIRATORY_TRACT | Status: DC
  Administered 2021-12-15 – 2021-12-17 (×5): 0.25 mg via RESPIRATORY_TRACT
  Filled 2021-12-15 (×5): qty 2

## 2021-12-15 MED ORDER — ROPINIROLE HCL 1 MG PO TABS
1.0000 mg | ORAL_TABLET | Freq: Every day | ORAL | Status: DC
  Administered 2021-12-15: 1 mg via ORAL
  Filled 2021-12-15: qty 1

## 2021-12-15 MED ORDER — ACETAMINOPHEN 325 MG PO TABS
650.0000 mg | ORAL_TABLET | Freq: Four times a day (QID) | ORAL | Status: DC | PRN
  Administered 2021-12-16 – 2021-12-19 (×2): 650 mg via ORAL
  Filled 2021-12-15 (×2): qty 2

## 2021-12-15 MED ORDER — METHYLPREDNISOLONE SODIUM SUCC 40 MG IJ SOLR
40.0000 mg | Freq: Two times a day (BID) | INTRAMUSCULAR | Status: DC
  Administered 2021-12-15 – 2021-12-19 (×9): 40 mg via INTRAVENOUS
  Filled 2021-12-15 (×9): qty 1

## 2021-12-15 MED ORDER — TRAMADOL HCL 50 MG PO TABS
50.0000 mg | ORAL_TABLET | Freq: Four times a day (QID) | ORAL | Status: DC | PRN
  Administered 2021-12-15 – 2021-12-20 (×12): 50 mg via ORAL
  Filled 2021-12-15 (×12): qty 1

## 2021-12-15 MED ORDER — CITALOPRAM HYDROBROMIDE 20 MG PO TABS
20.0000 mg | ORAL_TABLET | Freq: Every day | ORAL | Status: DC
  Administered 2021-12-15 – 2021-12-20 (×6): 20 mg via ORAL
  Filled 2021-12-15 (×6): qty 1

## 2021-12-15 MED ORDER — IOHEXOL 350 MG/ML SOLN
75.0000 mL | Freq: Once | INTRAVENOUS | Status: AC | PRN
  Administered 2021-12-15: 75 mL via INTRAVENOUS

## 2021-12-15 MED ORDER — NICOTINE 21 MG/24HR TD PT24
21.0000 mg | MEDICATED_PATCH | Freq: Every day | TRANSDERMAL | Status: DC
  Administered 2021-12-15 – 2021-12-20 (×6): 21 mg via TRANSDERMAL
  Filled 2021-12-15 (×6): qty 1

## 2021-12-15 MED ORDER — DILTIAZEM HCL ER COATED BEADS 120 MG PO CP24
120.0000 mg | ORAL_CAPSULE | Freq: Every day | ORAL | Status: DC
  Administered 2021-12-15 – 2021-12-20 (×6): 120 mg via ORAL
  Filled 2021-12-15 (×6): qty 1

## 2021-12-15 MED ORDER — INSULIN ASPART 100 UNIT/ML IJ SOLN: 0.0000 [IU] | Freq: Every day | INTRAMUSCULAR | Status: DC

## 2021-12-15 MED ORDER — IPRATROPIUM-ALBUTEROL 0.5-2.5 (3) MG/3ML IN SOLN
3.0000 mL | RESPIRATORY_TRACT | Status: DC | PRN
  Administered 2021-12-15: 3 mL via RESPIRATORY_TRACT
  Filled 2021-12-15: qty 3

## 2021-12-15 MED ORDER — INSULIN ASPART 100 UNIT/ML IJ SOLN
0.0000 [IU] | Freq: Three times a day (TID) | INTRAMUSCULAR | Status: DC
  Administered 2021-12-15: 2 [IU] via SUBCUTANEOUS
  Administered 2021-12-15: 7 [IU] via SUBCUTANEOUS
  Administered 2021-12-16 (×3): 3 [IU] via SUBCUTANEOUS
  Administered 2021-12-17: 1 [IU] via SUBCUTANEOUS
  Administered 2021-12-17: 3 [IU] via SUBCUTANEOUS
  Administered 2021-12-17: 5 [IU] via SUBCUTANEOUS
  Administered 2021-12-18: 2 [IU] via SUBCUTANEOUS
  Administered 2021-12-18 – 2021-12-19 (×3): 5 [IU] via SUBCUTANEOUS
  Administered 2021-12-19: 1 [IU] via SUBCUTANEOUS
  Administered 2021-12-19: 3 [IU] via SUBCUTANEOUS
  Administered 2021-12-20: 7 [IU] via SUBCUTANEOUS
  Administered 2021-12-20: 3 [IU] via SUBCUTANEOUS
  Filled 2021-12-15 (×2): qty 1

## 2021-12-15 MED ORDER — IPRATROPIUM-ALBUTEROL 0.5-2.5 (3) MG/3ML IN SOLN
3.0000 mL | Freq: Four times a day (QID) | RESPIRATORY_TRACT | Status: DC
  Administered 2021-12-15 – 2021-12-16 (×6): 3 mL via RESPIRATORY_TRACT
  Filled 2021-12-15 (×6): qty 3

## 2021-12-15 MED ORDER — PRAVASTATIN SODIUM 10 MG PO TABS
20.0000 mg | ORAL_TABLET | Freq: Every day | ORAL | Status: DC
  Administered 2021-12-15 – 2021-12-18 (×4): 20 mg via ORAL
  Filled 2021-12-15 (×4): qty 2

## 2021-12-15 MED ORDER — GLUCERNA SHAKE PO LIQD
237.0000 mL | Freq: Three times a day (TID) | ORAL | Status: DC
  Administered 2021-12-15 – 2021-12-20 (×16): 237 mL via ORAL
  Filled 2021-12-15 (×8): qty 237

## 2021-12-15 MED ORDER — ENOXAPARIN SODIUM 40 MG/0.4ML IJ SOSY
40.0000 mg | PREFILLED_SYRINGE | INTRAMUSCULAR | Status: DC
  Administered 2021-12-15 – 2021-12-20 (×6): 40 mg via SUBCUTANEOUS
  Filled 2021-12-15 (×6): qty 0.4

## 2021-12-15 MED ORDER — ASPIRIN 81 MG PO TBEC
81.0000 mg | DELAYED_RELEASE_TABLET | ORAL | Status: DC
  Administered 2021-12-15 – 2021-12-19 (×3): 81 mg via ORAL
  Filled 2021-12-15 (×3): qty 1

## 2021-12-15 MED ORDER — GUAIFENESIN-DM 100-10 MG/5ML PO SYRP
5.0000 mL | ORAL_SOLUTION | ORAL | Status: DC | PRN
  Administered 2021-12-15 – 2021-12-18 (×4): 5 mL via ORAL
  Filled 2021-12-15 (×3): qty 5

## 2021-12-15 NOTE — ED Notes (Signed)
Pt pulled off oxygen again while attempting to use urinal- O2 placed back on pt

## 2021-12-15 NOTE — Progress Notes (Signed)
PROGRESS NOTE    Nathan Turner  KGY:[id number] DOB: 1945/10/06 DOA: 12/14/2021 PCP: Glenda Chroman, MD   Brief Narrative:    Nathan Turner is a 76 y.o. male with medical history significant of T2DM, essential hypertension, hyperlipidemia, insomnia, tobacco abuse who presents to the emergency department with complaint of altered mental status.  He has also had a fall at home and was admitted with acute encephalopathy in the setting of hypoxemic respiratory failure.  He is noted to have a new right lower lobe mass on CT chest.  There is no finding of PE and he does have some interstitial edema and what appears to be a flareup of COPD.  Assessment & Plan:   Principal Problem:   Acute respiratory failure with hypoxia (HCC) Active Problems:   Altered mental status   Interstitial edema   Leukocytosis   Hypoalbuminemia due to protein-calorie malnutrition (HCC)   Tobacco abuse   Elevated d-dimer   Fall at home, initial encounter   Uncontrolled type 2 diabetes mellitus with hyperglycemia ([place])   Essential hypertension   Mixed hyperlipidemia   Descending aortic aneurysm (HCC)   Right lower lobe lung mass  Assessment and Plan:   Acute respiratory failure with hypoxia-multifactorial Patient was requiring supplemental oxygen to maintain normal O2 sats.  Shortness of breath started after sustaining a fall at home 2 days ago Appears to be related mostly to COPD exacerbation with some interstitial edema Lasix already given, patient appears euvolemic Trial of Solu-Medrol twice daily as well as scheduled DuoNebs and Pulmicort twice daily Continue incentive telemetry and flutter valve Continue supplemental oxygen to maintain O2 sats > 94% with plan to wean patient off supplemental oxygen as tolerated.   Acute encephalopathy secondary to above-improved Patient was alert and oriented x 3 Continue to monitor patient   Acute mild interstitial edema Chest x-ray showed mild interstitial  edema IV Lasix 20 mg x 1 was given on admission Check BNP and consider echocardiogram as needed   Right lower lobe mass CT angiography of chest showed spiculated mixed solid and cystic mass within the right lower lobe suspicious for a primary bronchogenic malignancy. Associated mediastinal and hilar adenopathy is suspicious for nodal metastatic disease. Consider pulmonology and oncology consult on discharge versus by 8/28   Elevated D-dimer D-dimer 1.34, CT angiography of chest showed no pulmonary embolism   Fall at home Continue fall precaution and neuro precaution PT recommendations for home health and walker   Thoracic descending aneurysm CT angiography of chest showed aneurysmal dilation of the descending thoracic aorta measuring up to 3.9 cm in diameter.  Annual imaging follow-up by CT or MRI recommended by radiologist   Leukocytosis possibly reactive WBC 12.4, continue to monitor WBC with morning labs   Hypoalbuminemia possibly secondary to mild protein calorie malnutrition Albumin 3.4, protein supplement to be given   T2DM with hyperglycemia CBG 188, patient was not on any diabetic medication at home Hemoglobin A1c will be checked Continue ISS and hypoglycemia protocol   Essential hypertension Continue diltiazem   Mixed hyperlipidemia Continue lovastatin and Lovaza   Insomnia Continue Restoril per home regimen   Tobacco abuse Patient was counseled on tobacco abuse cessation    DVT prophylaxis: Lovenox Code Status: Full Family Communication: Discussed with son on phone 8/26 Disposition Plan:  Status is: Observation The patient will require care spanning > 2 midnights and should be moved to inpatient because: Need for IV medications.   Consultants:  None  Procedures:  None  Antimicrobials:  None   Subjective: Patient seen and evaluated today with ongoing cough and mild shortness of breath this morning.  He seems alert and oriented this  morning.  Objective: Vitals:   12/15/21 1104 12/15/21 1130 12/15/21 1200 12/15/21 1211  BP:  127/67 121/70   Pulse: 87 93 90   Resp:  (!) 26 20   Temp:    98.1 F (36.7 C)  TempSrc:      SpO2:  91% (!) 86% 90%  Weight:      Height:        Intake/Output Summary (Last 24 hours) at 12/15/2021 1227 Last data filed at 12/15/2021 1100 Gross per 24 hour  Intake 457 ml  Output 600 ml  Net -143 ml   Filed Weights   12/14/21 1828 12/14/21 1854  Weight: 65.3 kg 65.3 kg    Examination:  General exam: Appears calm and comfortable  Respiratory system: Wheeze bilaterally. Respiratory effort normal.  Currently 4 L nasal cannula Cardiovascular system: S1 & S2 heard, RRR.  Gastrointestinal system: Abdomen is soft Central nervous system: Alert and awake Extremities: No edema Skin: No significant lesions noted Psychiatry: Flat affect.    Data Reviewed: I have personally reviewed following labs and imaging studies  CBC: Recent Labs  Lab 12/14/21 1850 12/15/21 0602  WBC 12.4* 11.2*  HGB 12.1* 12.2*  HCT 36.9* 36.2*  MCV 88.9 87.7  PLT 244 572   Basic Metabolic Panel: Recent Labs  Lab 12/14/21 1850 12/15/21 0602  NA 139 140  K 4.5 3.9  CL 101 101  CO2 27 32  GLUCOSE 188* 152*  BUN 43* 32*  CREATININE 1.20 0.89  CALCIUM 8.8* 8.6*   GFR: Estimated Creatinine Clearance: 55.1 mL/min (by C-G formula based on SCr of 0.89 mg/dL). Liver Function Tests: Recent Labs  Lab 12/14/21 1850 12/15/21 0602  AST 30 36  ALT 50* 50*  ALKPHOS 87 86  BILITOT 1.0 0.9  PROT 7.3 6.5  ALBUMIN 3.4* 2.9*   No results for input(s): "LIPASE", "AMYLASE" in the last 168 hours. No results for input(s): "AMMONIA" in the last 168 hours. Coagulation Profile: No results for input(s): "INR", "PROTIME" in the last 168 hours. Cardiac Enzymes: No results for input(s): "CKTOTAL", "CKMB", "CKMBINDEX", "TROPONINI" in the last 168 hours. BNP (last 3 results) No results for input(s): "PROBNP" in the  last 8760 hours. HbA1C: Recent Labs    12/14/21 1850  HGBA1C 6.4*   CBG: Recent Labs  Lab 12/14/21 1843 12/15/21 0811 12/15/21 1142  GLUCAP 202* 138* 170*   Lipid Profile: No results for input(s): "CHOL", "HDL", "LDLCALC", "TRIG", "CHOLHDL", "LDLDIRECT" in the last 72 hours. Thyroid Function Tests: No results for input(s): "TSH", "T4TOTAL", "FREET4", "T3FREE", "THYROIDAB" in the last 72 hours. Anemia Panel: No results for input(s): "VITAMINB12", "FOLATE", "FERRITIN", "TIBC", "IRON", "RETICCTPCT" in the last 72 hours. Sepsis Labs: No results for input(s): "PROCALCITON", "LATICACIDVEN" in the last 168 hours.  No results found for this or any previous visit (from the past 240 hour(s)).       Radiology Studies: CT Angio Chest Pulmonary Embolism (PE) W or WO Contrast  Result Date: 12/15/2021 CLINICAL DATA:  Pulmonary embolism (PE) suspected, positive D-dimer. Dyspnea on exertion, hypoxia. EXAM: CT ANGIOGRAPHY CHEST WITH CONTRAST TECHNIQUE: Multidetector CT imaging of the chest was performed using the standard protocol during bolus administration of intravenous contrast. Multiplanar CT image reconstructions and MIPs were obtained to evaluate the vascular anatomy. RADIATION DOSE REDUCTION: This exam was performed according to the  departmental dose-optimization program which includes automated exposure control, adjustment of the mA and/or kV according to patient size and/or use of iterative reconstruction technique. CONTRAST:  52mL OMNIPAQUE IOHEXOL 350 MG/ML SOLN COMPARISON:  None Available. FINDINGS: Cardiovascular: Extensive multi-vessel coronary artery calcification. Global cardiac size is within normal limits. No pericardial effusion. Central pulmonary arteries are adequately opacified. No intraluminal filling defect is identified to suggest acute pulmonary embolism. Central pulmonary arteries are of normal caliber. There is dilation of the descending thoracic aorta measuring up to 3.9  cm in diameter in its proximal segment just beyond the takeoff of the left subclavian artery. The descending thoracic aorta measures 3.3 cm in diameter distally at the level of the left atrium. The ascending aorta is of normal caliber. Moderate superimposed mixed atherosclerotic plaque. Mediastinum/Nodes: The visualized thyroid is unremarkable. Multiple mildly enlarged mediastinal and hilar lymph nodes are identified measuring up to 18 mm in short axis diameter within the subcarinal lymph node group. Lungs/Pleura: Mild emphysema There is a spiculated mixed solid and cystic mass within the right lower lobe at axial image # 61/6 suspicious for a primary bronchogenic malignancy. An additional mildly spiculated nodule within the right upper lobe at axial image # 26/6 measures 10 mm x 14 mm and is suspicious for a a metachronous primary or intrapulmonary metastasis. There is superimposed widespread extensive peribronchial and centrilobular nodularity with more extensive consolidative opacity within the lung bases bilaterally. In the acute setting, this may reflect changes of superimposed atypical infection. There is associated bronchial wall thickening in keeping with airway inflammation. No pneumothorax or pleural effusion. Upper Abdomen: No acute abnormality Musculoskeletal: No lytic or blastic bone lesion. No acute bone abnormality. Review of the MIP images confirms the above findings. IMPRESSION: 1. No pulmonary embolism. 2. Spiculated mixed solid and cystic mass within the right lower lobe suspicious for a primary bronchogenic malignancy. Associated mediastinal and hilar adenopathy is suspicious for nodal metastatic disease though a component of reactive change may be present. 3. Additional spiculated nodule within the right upper lobe suspicious for a metachronous primary or intrapulmonary metastasis. 4. Widespread centrilobular and peribronchial nodularity with more extensive consolidative opacity within the lung  bases bilaterally. In the acute setting, this may reflect changes of superimposed atypical infection. Follow-up chest CT with contrast following a conservative therapy in 6 weeks would be helpful in confirming this and re-evaluating in the above findings. Thoracic multi disciplinary referral would also be helpful for further management. 5. Mild emphysema. 6. Extensive multi-vessel coronary artery calcification. 7. Aneurysmal dilation of the descending thoracic aorta measuring up to 3.9 cm in diameter. Recommend annual imaging followup by CTA or MRA. This recommendation follows 2010 ACCF/AHA/AATS/ACR/ASA/SCA/SCAI/SIR/STS/SVM Guidelines for the Diagnosis and Management of Patients with Thoracic Aortic Disease. Circulation.2010; 121: K932-I712. Aortic aneurysm NOS (ICD10-I71.9) Emphysema (ICD10-J43.9). Electronically Signed   By: Fidela Salisbury M.D.   On: 12/15/2021 01:32   CT Head Wo Contrast  Result Date: 12/14/2021 CLINICAL DATA:  Head trauma EXAM: CT HEAD WITHOUT CONTRAST TECHNIQUE: Contiguous axial images were obtained from the base of the skull through the vertex without intravenous contrast. RADIATION DOSE REDUCTION: This exam was performed according to the departmental dose-optimization program which includes automated exposure control, adjustment of the mA and/or kV according to patient size and/or use of iterative reconstruction technique. COMPARISON:  None Available. FINDINGS: Brain: No acute territorial infarction, hemorrhage or intracranial mass. Atrophy. Patchy white matter hypodensity consistent with chronic small vessel ischemic change. Nonenlarged ventricles Vascular: No hyperdense vessels.  Carotid  vascular calcification Skull: Normal. Negative for fracture or focal lesion. Sinuses/Orbits: No acute finding. Other: None IMPRESSION: 1. No CT evidence for acute intracranial abnormality. 2. Atrophy and chronic small vessel ischemic changes of the white matter Electronically Signed   By: Donavan Foil  M.D.   On: 12/14/2021 23:12   DG Chest Portable 1 View  Result Date: 12/14/2021 CLINICAL DATA:  New oxygen requirement EXAM: PORTABLE CHEST 1 VIEW COMPARISON:  None Available. FINDINGS: Diffuse mild interstitial opacity. Normal cardiomediastinal contours. No pleural effusion or pneumothorax. IMPRESSION: Mild interstitial pulmonary edema. Electronically Signed   By: Ulyses Jarred M.D.   On: 12/14/2021 22:43        Scheduled Meds:  budesonide (PULMICORT) nebulizer solution  0.25 mg Nebulization BID   diltiazem  120 mg Oral Daily   enoxaparin (LOVENOX) injection  40 mg Subcutaneous Q24H   feeding supplement (GLUCERNA SHAKE)  237 mL Oral TID BM   insulin aspart  0-9 Units Subcutaneous TID WC   ipratropium-albuterol  3 mL Nebulization Q6H   pravastatin  20 mg Oral q1800   temazepam  15 mg Oral QHS     LOS: 0 days    Time spent: 35 minutes    Dreyden Rohrman Darleen Crocker, DO [facility]  If 7PM-7AM, please contact night-coverage [email or web address] 12/15/2021, 12:27 PM

## 2021-12-15 NOTE — Evaluation (Addendum)
Physical Therapy Evaluation Patient Details Name: Nathan Turner MRN: [id number] DOB: [birth year] Today's Date: 12/15/2021  History of Present Illness  Pt wife died six days ago.  Pt son came home and Pt was found confused therefore he was brought to the ER for management.  PT states he normally does not need an assistive device to walk with.  Clinical Impression  Pt will benefit from PT for education on energy conservation and breathing exercises.  PT able to ambulate with SBA with O2 at 6 L and RW.  Normally pt is I in ambulation without O2 .        Recommendations for follow up therapy are one component of a multi-disciplinary discharge planning process, led by the attending physician.  Recommendations may be updated based on patient status, additional functional criteria and insurance authorization.  Follow Up Recommendations Home health PT      Assistance Recommended at Discharge Frequent or constant Supervision/Assistance (PT is impulsive)  Patient can return home with the following  A little help with walking and/or transfers;Assistance with cooking/housework    Equipment Recommendations Rolling walker (2 wheels);Other (comment) (O2)  Recommendations for Other Services       Functional Status Assessment       Precautions / Restrictions Precautions Precautions: Fall Required Braces or Orthoses:  (none) Restrictions Weight Bearing Restrictions: No      Mobility  Bed Mobility Overal bed mobility: Independent                  Transfers Overall transfer level: Independent                      Ambulation/Gait Ambulation/Gait assistance: Supervision Gait Distance (Feet): 80 Feet Assistive device: Rolling walker (2 wheels) (O2 on 6 L) Gait Pattern/deviations: Step-through pattern    PT O2 at end of ambulation is 90      Stairs            Wheelchair Mobility    Modified Rankin (Stroke Patients Only)       Balance                                              Pertinent Vitals/Pain      Home Living     Available Help at Discharge: Family Type of Home: House Home Access: Ramped entrance       Home Layout: One level        Prior Function Prior Level of Function : Independent/Modified Independent                     Hand Dominance        Extremity/Trunk Assessment        Lower Extremity Assessment Lower Extremity Assessment: Overall WFL for tasks assessed       Communication   Communication: No difficulties  Cognition Arousal/Alertness: Awake/alert                                                     Assessment/Plan    PT Assessment Patient needs continued PT services  PT Problem List         PT Treatment Interventions Therapeutic activities;Functional mobility training  PT Goals (Current goals can be found in the Care Plan section)  Acute Rehab PT Goals Patient Stated Goal: to go home Potential to Achieve Goals: Good    Frequency Min 3X/week     Co-evaluation               AM-PAC PT "6 Clicks" Mobility  Outcome Measure Help needed turning from your back to your side while in a flat bed without using bedrails?: None Help needed moving from lying on your back to sitting on the side of a flat bed without using bedrails?: None Help needed moving to and from a bed to a chair (including a wheelchair)?: A Little Help needed standing up from a chair using your arms (e.g., wheelchair or bedside chair)?: None Help needed to walk in hospital room?: A Little Help needed climbing 3-5 steps with a railing? : A Lot 6 Click Score: 20    End of Session     Patient left: in bed;with call bell/phone within reach   PT Visit Diagnosis: Unsteadiness on feet (R26.81)    Time: 9417-4081 PT Time Calculation (min) (ACUTE ONLY): 32 min   Charges:   PT Evaluation $PT Eval Low Complexity: 1 Low         Rayetta Humphrey, PT  CLT [phone]  12/15/2021, 11:26 AM

## 2021-12-15 NOTE — H&P (Signed)
History and Physical    Patient: Nathan Turner DOB: 27-Mar-1946 DOA: 12/14/2021 DOS: the patient was seen and examined on 12/15/2021 PCP: Glenda Chroman, MD  Patient coming from: Home  Chief Complaint:  Chief Complaint  Patient presents with   Fall   Altered Mental Status   HPI: Nathan Turner is a 76 y.o. male with medical history significant of T2DM, essential hypertension, hyperlipidemia, insomnia, tobacco abuse who presents to the emergency department with complaint of altered mental status.  Patient recently lost his wife about 3 weeks ago and was at home with his stepson.  2 days ago, stepson went out of town to take care of some affairs regarding patient and late wife's property, so, patient was left at home by himself.  Patient had a fall at home 2 days ago and sustained an abrasion of the head, stepson called about 4 PM yesterday and the patient was acting is normal self, but today, there was a noticeable change in patient's mental status due to not acting his normal self per patient stepson, so he was taken to the ED for further evaluation and management.  There was no report of chest pain, fever, chills, nausea, vomiting, abdominal pain.  ED Course:  In the emergency department, patient was intermittently tachypneic.  He was noted to be hypoxic with O2 sat of 85% on room air, patient does not use oxygen at baseline.  Supplemental oxygen was provided at 4 LPM with improved O2 sat at 91-92%.  Work-up in the ED showed leukocytosis, normocytic anemia.  BMP showed hyperglycemia, BUN 43, creatinine 1.20, albumin 3.4.  D-dimer 1.34 CT head without contrast showed no CT evidence of acute intracranial abnormality Chest x-ray showed mild interstitial pulmonary edema CT angiogram of chest showed: 1. No pulmonary embolism. 2. Spiculated mixed solid and cystic mass within the right lower lobe suspicious for a primary bronchogenic malignancy. Associated mediastinal and hilar  adenopathy is suspicious for nodal metastatic disease though a component of reactive change may be present. 3. Additional spiculated nodule within the right upper lobe suspicious for a metachronous primary or intrapulmonary metastasis. 4. Widespread centrilobular and peribronchial nodularity with more extensive consolidative opacity within the lung bases bilaterally. In the acute setting, this may reflect changes of superimposed atypical infection. Follow-up chest CT with contrast following a conservative therapy in 6 weeks would be helpful in confirming this and re-evaluating in the above findings. Thoracic multi disciplinary referral would also be helpful for further management. 5. Mild emphysema. 6. Extensive multi-vessel coronary artery calcification. 7. Aneurysmal dilation of the descending thoracic aorta measuring up to 3.9 cm in diameter.  IV Lasix 20 mg x 1 was given.  Hospitalist was asked to admit patient for further evaluation and management.  Review of Systems: Review of systems as noted in the HPI. All other systems reviewed and are negative.   Past Medical History:  Diagnosis Date   Amputation of left index finger    Anxiety    Arthritis    back   Complication of anesthesia    very loopy and confused   Hypertension    Pre-diabetes    Past Surgical History:  Procedure Laterality Date   AMPUTATION Left 11/16/2019   Procedure: REVISION AMPUTATION LEFT INDEX FINGER;  Surgeon: Leanora Cover, MD;  Location: [place];  Service: Orthopedics;  Laterality: Left;   CERVICAL SPINE SURGERY     COLONOSCOPY     TONSILLECTOMY      Social History:  reports that he has been smoking cigars. He has a 64.00 pack-year smoking history. He has never used smokeless tobacco. He reports that he does not drink alcohol and does not use drugs.   No Known Allergies  Family history: No pertinent family history  Prior to Admission medications   Medication Sig Start Date End Date  Taking? Authorizing Provider  aspirin EC 81 MG tablet Take 81 mg by mouth every other day.     [provider]  cephALEXin (KEFLEX) 500 MG capsule Take 500 mg by mouth 4 (four) times daily.    [provider]  citalopram (CELEXA) 20 MG tablet Take 20 mg by mouth daily.    [provider]  diltiazem (CARTIA XT) 120 MG 24 hr capsule Take 120 mg by mouth daily.    [provider]  lovastatin (MEVACOR) 20 MG tablet Take 20 mg by mouth daily.     [provider]  Omega-3 Fatty Acids (FISH OIL) 1200 MG CAPS Take 1,200 mg by mouth daily.    [provider]  rOPINIRole (REQUIP) 1 MG tablet Take 1 mg by mouth at bedtime.    [provider]  temazepam (RESTORIL) 15 MG capsule Take 15 mg by mouth at bedtime.     [provider]  traMADol (ULTRAM) 50 MG tablet Take by mouth every 6 (six) hours as needed.    [provider]    Physical Exam: BP (!) 144/65   Pulse 71   Temp 98.4 F (36.9 C) (Oral)   Resp (!) 22   Ht 5\' 3"  (1.6 m)   Wt 65.3 kg   SpO2 93%   BMI 25.51 kg/m   General: 76 y.o. year-old male well developed well nourished in no acute distress.  Alert and oriented x3. HEENT: NCAT, EOMI, dry mucous membrane Neck: Supple, trachea medial Cardiovascular: Regular rate and rhythm with no rubs or gallops.  No thyromegaly or JVD noted.  No lower extremity edema. 2/4 pulses in all 4 extremities. Respiratory: Clear to auscultation with no wheezes or rales. Good inspiratory effort. Abdomen: Soft, nontender nondistended with normal bowel sounds x4 quadrants. Muskuloskeletal: No cyanosis, clubbing or edema noted bilaterally Neuro: CN II-XII intact, strength 5/5 x 4, sensation, reflexes intact Skin: No ulcerative lesions noted or rashes Psychiatry: Mood is appropriate for condition and setting          Labs on Admission:  Basic Metabolic Panel: Recent Labs  Lab 12/14/21 1850  NA 139  K 4.5  CL 101  CO2 27   GLUCOSE 188*  BUN 43*  CREATININE 1.20  CALCIUM 8.8*   Liver Function Tests: Recent Labs  Lab 12/14/21 1850  AST 30  ALT 50*  ALKPHOS 87  BILITOT 1.0  PROT 7.3  ALBUMIN 3.4*   No results for input(s): "LIPASE", "AMYLASE" in the last 168 hours. No results for input(s): "AMMONIA" in the last 168 hours. CBC: Recent Labs  Lab 12/14/21 1850  WBC 12.4*  HGB 12.1*  HCT 36.9*  MCV 88.9  PLT 244   Cardiac Enzymes: No results for input(s): "CKTOTAL", "CKMB", "CKMBINDEX", "TROPONINI" in the last 168 hours.  BNP (last 3 results) No results for input(s): "BNP" in the last 8760 hours.  ProBNP (last 3 results) No results for input(s): "PROBNP" in the last 8760 hours.  CBG: Recent Labs  Lab 12/14/21 1843  GLUCAP 202*    Radiological Exams on Admission: CT Head Wo Contrast  Result Date: 12/14/2021 CLINICAL DATA:  Head  trauma EXAM: CT HEAD WITHOUT CONTRAST TECHNIQUE: Contiguous axial images were obtained from the base of the skull through the vertex without intravenous contrast. RADIATION DOSE REDUCTION: This exam was performed according to the departmental dose-optimization program which includes automated exposure control, adjustment of the mA and/or kV according to patient size and/or use of iterative reconstruction technique. COMPARISON:  None Available. FINDINGS: Brain: No acute territorial infarction, hemorrhage or intracranial mass. Atrophy. Patchy white matter hypodensity consistent with chronic small vessel ischemic change. Nonenlarged ventricles Vascular: No hyperdense vessels.  Carotid vascular calcification Skull: Normal. Negative for fracture or focal lesion. Sinuses/Orbits: No acute finding. Other: None IMPRESSION: 1. No CT evidence for acute intracranial abnormality. 2. Atrophy and chronic small vessel ischemic changes of the white matter Electronically Signed   By: Donavan Foil M.D.   On: 12/14/2021 23:12   DG Chest Portable 1 View  Result Date: 12/14/2021 CLINICAL  DATA:  New oxygen requirement EXAM: PORTABLE CHEST 1 VIEW COMPARISON:  None Available. FINDINGS: Diffuse mild interstitial opacity. Normal cardiomediastinal contours. No pleural effusion or pneumothorax. IMPRESSION: Mild interstitial pulmonary edema. Electronically Signed   By: Ulyses Jarred M.D.   On: 12/14/2021 22:43    EKG: I independently viewed the EKG done and my findings are as followed: Normal sinus rhythm at rate of 77 bpm with RBBB  Assessment/Plan Present on Admission:  Acute respiratory failure with hypoxia ([place])  Principal Problem:   Acute respiratory failure with hypoxia ([place]) Active Problems:   Altered mental status   Interstitial edema   Leukocytosis   Hypoalbuminemia due to protein-calorie malnutrition ([facility])   Tobacco abuse   Elevated d-dimer   Fall at home, initial encounter   Uncontrolled type 2 diabetes mellitus with hyperglycemia ([place])   Essential hypertension   Mixed hyperlipidemia   Acute respiratory failure with hypoxia Patient was requiring supplemental oxygen to maintain normal O2 sats.  Shortness of breath started after sustaining a fall at home 2 days ago Because of patient's hypoxia is currently unknown, however, due to patient's lung history of tobacco abuse, it is possible that he has an undiagnosed COPD; though patient did not present with any wheezing on physical exam.  CT angiography of chest showed mild emphysema. Continue DuoNebs as needed Continue incentive telemetry and flutter valve Continue supplemental oxygen to maintain O2 sats > 94% with plan to wean patient off supplemental oxygen as tolerated.  Altered mental status-improved Patient was alert and oriented x 3 Continue to monitor patient  Acute mild interstitial edema Chest x-ray showed mild interstitial edema IV Lasix 20 mg x 1 was given.  Right lower lobe mass CT angiography of chest showed spiculated mixed solid and cystic mass within the right lower lobe suspicious for a primary  bronchogenic malignancy. Associated mediastinal and hilar adenopathy is suspicious for nodal metastatic disease. Consider pulmonology and oncology consult  Elevated D-dimer D-dimer 1.34, CT angiography of chest showed no pulmonary embolism  Fall at home Continue fall precaution and neuro precaution Continue PT/OT eval and treat  Thoracic descending aneurysm CT angiography of chest showed aneurysmal dilation of the descending thoracic aorta measuring up to 3.9 cm in diameter.  Annual imaging follow-up by CT or MRI recommended by radiologist  Leukocytosis possibly reactive WBC 12.4, continue to monitor WBC with morning labs  Hypoalbuminemia possibly secondary to mild protein calorie malnutrition Albumin 3.4, protein supplement to be given  T2DM with hyperglycemia CBG 188, patient was not on any diabetic medication at home Hemoglobin A1c will be checked Continue  ISS and hypoglycemia protocol  Essential hypertension Continue diltiazem  Mixed hyperlipidemia Continue lovastatin and Lovaza  Insomnia Continue Restoril per home regimen  Tobacco abuse Patient was counseled on tobacco abuse cessation  DVT prophylaxis: Lovenox  Code Status: Full code  Consults: None  Family Communication: None at bedside  Severity of Illness: The appropriate patient status for this patient is OBSERVATION. Observation status is judged to be reasonable and necessary in order to provide the required intensity of service to ensure the patient's safety. The patient's presenting symptoms, physical exam findings, and initial radiographic and laboratory data in the context of their medical condition is felt to place them at decreased risk for further clinical deterioration. Furthermore, it is anticipated that the patient will be medically stable for discharge from the hospital within 2 midnights of admission.   Author: Bernadette Hoit, DO 12/15/2021 12:51 AM  For on call review [email or web address].

## 2021-12-15 NOTE — ED Notes (Signed)
Lab at bedside for morning lab draw

## 2021-12-15 NOTE — TOC Progression Note (Signed)
  Transition of Care [facility]) Screening Note   Patient Details  Name: Nathan Turner Date of Birth: May 30, 1945   [facility] CM/SW Contact:    Shade Flood, LCSW Phone Number: 12/15/2021, 3:32 PM    Pt currently observation status though UR RN informs TOC that status will change to inpatient. PT recommending HHPT at dc. TOC will follow up when dc timeframe identified to assist with needs.  Transition of Care Department [facility]) has reviewed patient and no TOC needs have been identified at this time. We will continue to monitor patient advancement through interdisciplinary progression rounds. If new patient transition needs arise, please place a TOC consult.

## 2021-12-15 NOTE — ED Notes (Signed)
Walked by room and happened to see pt O2 sats were in 80's pt had taken off [place]- pt said that he did not take it off, however it was not on pt when entering room- [place] placed back on pt- O2 is set at 4L at this time. Pt had wet bed attempting to get pants down in time enough to use urinal pants and sheet wet, pants removed, bottom sheet changed, urinal emptied and placed back at bedside., warm blanket given

## 2021-12-15 NOTE — ED Notes (Addendum)
PT at bedside.

## 2021-12-16 ENCOUNTER — Inpatient Hospital Stay (HOSPITAL_COMMUNITY): Payer: Medicare Other

## 2021-12-16 DIAGNOSIS — J9601 Acute respiratory failure with hypoxia: Secondary | ICD-10-CM | POA: Diagnosis not present

## 2021-12-16 DIAGNOSIS — I5031 Acute diastolic (congestive) heart failure: Secondary | ICD-10-CM | POA: Diagnosis not present

## 2021-12-16 LAB — CBC
HCT: 36.3 % — ABNORMAL LOW (ref 39.0–52.0)
Hemoglobin: 12.2 g/dL — ABNORMAL LOW (ref 13.0–17.0)
MCH: 29.3 pg (ref 26.0–34.0)
MCHC: 33.6 g/dL (ref 30.0–36.0)
MCV: 87.3 fL (ref 80.0–100.0)
Platelets: 260 10*3/uL (ref 150–400)
RBC: 4.16 MIL/uL — ABNORMAL LOW (ref 4.22–5.81)
RDW: 13.9 % (ref 11.5–15.5)
WBC: 13.1 10*3/uL — ABNORMAL HIGH (ref 4.0–10.5)
nRBC: 0 % (ref 0.0–0.2)

## 2021-12-16 LAB — ECHOCARDIOGRAM COMPLETE
Area-P 1/2: 2.33 cm2
Height: 63 in
S' Lateral: 3.7 cm
Weight: 2304 oz

## 2021-12-16 LAB — BASIC METABOLIC PANEL
Anion gap: 9 (ref 5–15)
BUN: 24 mg/dL — ABNORMAL HIGH (ref 8–23)
CO2: 29 mmol/L (ref 22–32)
Calcium: 8.2 mg/dL — ABNORMAL LOW (ref 8.9–10.3)
Chloride: 99 mmol/L (ref 98–111)
Creatinine, Ser: 0.76 mg/dL (ref 0.61–1.24)
GFR, Estimated: >60 mL/min (ref >60)
Glucose, Bld: 231 mg/dL — ABNORMAL HIGH (ref 70–99)
Potassium: 4 mmol/L (ref 3.5–5.1)
Sodium: 137 mmol/L (ref 135–145)

## 2021-12-16 LAB — GLUCOSE, CAPILLARY
Glucose-Capillary: 235 mg/dL — ABNORMAL HIGH (ref 70–99)
Glucose-Capillary: 242 mg/dL — ABNORMAL HIGH (ref 70–99)
Glucose-Capillary: 214 mg/dL — ABNORMAL HIGH (ref 70–99)
Glucose-Capillary: 252 mg/dL — ABNORMAL HIGH (ref 70–99)

## 2021-12-16 LAB — MAGNESIUM: Magnesium: 2.2 mg/dL (ref 1.7–2.4)

## 2021-12-16 MED ORDER — ROPINIROLE HCL 0.25 MG PO TABS
0.5000 mg | ORAL_TABLET | Freq: Every day | ORAL | Status: DC
  Administered 2021-12-16 – 2021-12-19 (×4): 0.5 mg via ORAL
  Filled 2021-12-16 (×4): qty 2

## 2021-12-16 MED ORDER — FUROSEMIDE 10 MG/ML IJ SOLN
40.0000 mg | Freq: Two times a day (BID) | INTRAMUSCULAR | Status: DC
Start: 2021-12-16 — End: 2021-12-17
  Administered 2021-12-16 – 2021-12-17 (×2): 40 mg via INTRAVENOUS
  Filled 2021-12-16 (×2): qty 4

## 2021-12-16 MED ORDER — ALPRAZOLAM 0.25 MG PO TABS
0.2500 mg | ORAL_TABLET | Freq: Two times a day (BID) | ORAL | Status: DC | PRN
Start: 2021-12-16 — End: 2021-12-20
  Administered 2021-12-16 – 2021-12-19 (×3): 0.25 mg via ORAL
  Filled 2021-12-16 (×4): qty 1

## 2021-12-16 MED ORDER — FUROSEMIDE 10 MG/ML IJ SOLN
40.0000 mg | Freq: Once | INTRAMUSCULAR | Status: AC
  Administered 2021-12-16: 40 mg via INTRAVENOUS
  Filled 2021-12-16: qty 4

## 2021-12-16 NOTE — Progress Notes (Signed)
Have increased oxygen to 6 liters due to low saturation and increased work of breathing.

## 2021-12-16 NOTE — Progress Notes (Addendum)
PROGRESS NOTE    [place] CLASBY  ZTI:[id number] DOB: 02-Apr-1946 DOA: 12/14/2021 PCP: Glenda Chroman, MD   Brief Narrative:    Nathan Turner is a 76 y.o. male with medical history significant of T2DM, essential hypertension, hyperlipidemia, insomnia, tobacco abuse who presents to the emergency department with complaint of altered mental status.  He has also had a fall at home and was admitted with acute encephalopathy in the setting of hypoxemic respiratory failure.  He is noted to have a new right lower lobe mass on CT chest.  There is no finding of PE and he does have some interstitial edema and what appears to be a flareup of COPD along with some acute CHF.  Assessment & Plan:   Principal Problem:   Acute respiratory failure with hypoxia (HCC) Active Problems:   Altered mental status   Interstitial edema   Leukocytosis   Hypoalbuminemia due to protein-calorie malnutrition (HCC)   Tobacco abuse   Elevated d-dimer   Fall at home, initial encounter   Uncontrolled type 2 diabetes mellitus with hyperglycemia ([place])   Essential hypertension   Mixed hyperlipidemia   Descending aortic aneurysm (HCC)   Right lower lobe lung mass   Acute hypercapnic respiratory failure (HCC)  Assessment and Plan:   Acute respiratory failure with hypoxia-multifactorial Patient was requiring supplemental oxygen to maintain normal O2 sats.  Shortness of breath started after sustaining a fall at home 2 days ago Appears to be related mostly to COPD exacerbation with some interstitial edema Lasix already given, patient appears euvolemic Trial of Solu-Medrol twice daily as well as scheduled DuoNebs and Pulmicort twice daily Continue incentive telemetry and flutter valve Continue supplemental oxygen to maintain O2 sats > 94% with plan to wean patient off supplemental oxygen as tolerated.   Acute encephalopathy secondary to above Patient has periods of agitation and confusion for which Haldol ordered  prn CT head NAD Consider Brain MRI by am to reassess Continue to monitor patient   Acute mild interstitial edema Chest x-ray showed mild interstitial edema IV Lasix 40 mg twice daily as ordered with daily weights and strict I's and O's BNP elevation noted, plan to check 2D echocardiogram   Right lower lobe mass CT angiography of chest showed spiculated mixed solid and cystic mass within the right lower lobe suspicious for a primary bronchogenic malignancy. Associated mediastinal and hilar adenopathy is suspicious for nodal metastatic disease. Consider pulmonology and oncology consult on discharge versus by 8/28   Elevated D-dimer D-dimer 1.34, CT angiography of chest showed no pulmonary embolism   Fall at home Continue fall precaution and neuro precaution PT recommendations for home health and walker   Thoracic descending aneurysm CT angiography of chest showed aneurysmal dilation of the descending thoracic aorta measuring up to 3.9 cm in diameter.  Annual imaging follow-up by CT or MRI recommended by radiologist   Leukocytosis possibly reactive WBC 12.4, continue to monitor WBC with morning labs   Hypoalbuminemia possibly secondary to mild protein calorie malnutrition Albumin 3.4, protein supplement to be given   T2DM with hyperglycemia CBG 188, patient was not on any diabetic medication at home Hemoglobin A1c 6.4% Continue ISS and hypoglycemia protocol   Essential hypertension Continue diltiazem   Mixed hyperlipidemia Continue lovastatin and Lovaza   Insomnia Continue Restoril per home regimen   Tobacco abuse Patient was counseled on tobacco abuse cessation    DVT prophylaxis: Lovenox Code Status: Full Family Communication: Discussed with son on phone 8/27 Disposition Plan:  Status is: Inpatient Remains inpatient appropriate because: Need for IV medications  Consultants:  Pulmonology  Procedures:  See  Antimicrobials:  None   Subjective: Patient  seen and evaluated today with increased work of breathing and higher oxygen requirement noted.  Objective: Vitals:   12/16/21 0217 12/16/21 0434 12/16/21 0712 12/16/21 0717  BP:  (!) 149/80    Pulse:  69    Resp:  (!) 21    Temp:  98 F (36.7 C)    TempSrc:  Oral    SpO2: (!) 89% 92% 92% 92%  Weight:      Height:        Intake/Output Summary (Last 24 hours) at 12/16/2021 1010 Last data filed at 12/16/2021 0839 Gross per 24 hour  Intake 933 ml  Output 600 ml  Net 333 ml   Filed Weights   12/14/21 1828 12/14/21 1854  Weight: 65.3 kg 65.3 kg    Examination:  General exam: Appears calm and comfortable , confused Respiratory system: Clear to auscultation. Respiratory effort normal.  6 L nasal cannula Cardiovascular system: S1 & S2 heard, RRR.  Gastrointestinal system: Abdomen is soft Central nervous system: Alert and awake Extremities: No edema Skin: No significant lesions noted Psychiatry: Flat affect.    Data Reviewed: I have personally reviewed following labs and imaging studies  CBC: Recent Labs  Lab 12/14/21 1850 12/15/21 0602 12/16/21 0557  WBC 12.4* 11.2* 13.1*  HGB 12.1* 12.2* 12.2*  HCT 36.9* 36.2* 36.3*  MCV 88.9 87.7 87.3  PLT 244 249 638   Basic Metabolic Panel: Recent Labs  Lab 12/14/21 1850 12/15/21 0602 12/16/21 0557  NA 139 140 137  K 4.5 3.9 4.0  CL 101 101 99  CO2 27 32 29  GLUCOSE 188* 152* 231*  BUN 43* 32* 24*  CREATININE 1.20 0.89 0.76  CALCIUM 8.8* 8.6* 8.2*  MG  --   --  2.2   GFR: Estimated Creatinine Clearance: 61.2 mL/min (by C-G formula based on SCr of 0.76 mg/dL). Liver Function Tests: Recent Labs  Lab 12/14/21 1850 12/15/21 0602  AST 30 36  ALT 50* 50*  ALKPHOS 87 86  BILITOT 1.0 0.9  PROT 7.3 6.5  ALBUMIN 3.4* 2.9*   No results for input(s): "LIPASE", "AMYLASE" in the last 168 hours. No results for input(s): "AMMONIA" in the last 168 hours. Coagulation Profile: No results for input(s): "INR", "PROTIME" in  the last 168 hours. Cardiac Enzymes: No results for input(s): "CKTOTAL", "CKMB", "CKMBINDEX", "TROPONINI" in the last 168 hours. BNP (last 3 results) No results for input(s): "PROBNP" in the last 8760 hours. HbA1C: Recent Labs    12/14/21 1850  HGBA1C 6.4*   CBG: Recent Labs  Lab 12/15/21 1142 12/15/21 1345 12/15/21 1642 12/15/21 2143 12/16/21 0731  GLUCAP 170* 214* 349* 161* 235*   Lipid Profile: No results for input(s): "CHOL", "HDL", "LDLCALC", "TRIG", "CHOLHDL", "LDLDIRECT" in the last 72 hours. Thyroid Function Tests: No results for input(s): "TSH", "T4TOTAL", "FREET4", "T3FREE", "THYROIDAB" in the last 72 hours. Anemia Panel: No results for input(s): "VITAMINB12", "FOLATE", "FERRITIN", "TIBC", "IRON", "RETICCTPCT" in the last 72 hours. Sepsis Labs: No results for input(s): "PROCALCITON", "LATICACIDVEN" in the last 168 hours.  No results found for this or any previous visit (from the past 240 hour(s)).       Radiology Studies: CT Angio Chest Pulmonary Embolism (PE) W or WO Contrast  Result Date: 12/15/2021 CLINICAL DATA:  Pulmonary embolism (PE) suspected, positive D-dimer. Dyspnea on exertion, hypoxia. EXAM: CT  ANGIOGRAPHY CHEST WITH CONTRAST TECHNIQUE: Multidetector CT imaging of the chest was performed using the standard protocol during bolus administration of intravenous contrast. Multiplanar CT image reconstructions and MIPs were obtained to evaluate the vascular anatomy. RADIATION DOSE REDUCTION: This exam was performed according to the departmental dose-optimization program which includes automated exposure control, adjustment of the mA and/or kV according to patient size and/or use of iterative reconstruction technique. CONTRAST:  62mL OMNIPAQUE IOHEXOL 350 MG/ML SOLN COMPARISON:  None Available. FINDINGS: Cardiovascular: Extensive multi-vessel coronary artery calcification. Global cardiac size is within normal limits. No pericardial effusion. Central pulmonary  arteries are adequately opacified. No intraluminal filling defect is identified to suggest acute pulmonary embolism. Central pulmonary arteries are of normal caliber. There is dilation of the descending thoracic aorta measuring up to 3.9 cm in diameter in its proximal segment just beyond the takeoff of the left subclavian artery. The descending thoracic aorta measures 3.3 cm in diameter distally at the level of the left atrium. The ascending aorta is of normal caliber. Moderate superimposed mixed atherosclerotic plaque. Mediastinum/Nodes: The visualized thyroid is unremarkable. Multiple mildly enlarged mediastinal and hilar lymph nodes are identified measuring up to 18 mm in short axis diameter within the subcarinal lymph node group. Lungs/Pleura: Mild emphysema There is a spiculated mixed solid and cystic mass within the right lower lobe at axial image # 61/6 suspicious for a primary bronchogenic malignancy. An additional mildly spiculated nodule within the right upper lobe at axial image # 26/6 measures 10 mm x 14 mm and is suspicious for a a metachronous primary or intrapulmonary metastasis. There is superimposed widespread extensive peribronchial and centrilobular nodularity with more extensive consolidative opacity within the lung bases bilaterally. In the acute setting, this may reflect changes of superimposed atypical infection. There is associated bronchial wall thickening in keeping with airway inflammation. No pneumothorax or pleural effusion. Upper Abdomen: No acute abnormality Musculoskeletal: No lytic or blastic bone lesion. No acute bone abnormality. Review of the MIP images confirms the above findings. IMPRESSION: 1. No pulmonary embolism. 2. Spiculated mixed solid and cystic mass within the right lower lobe suspicious for a primary bronchogenic malignancy. Associated mediastinal and hilar adenopathy is suspicious for nodal metastatic disease though a component of reactive change may be present. 3.  Additional spiculated nodule within the right upper lobe suspicious for a metachronous primary or intrapulmonary metastasis. 4. Widespread centrilobular and peribronchial nodularity with more extensive consolidative opacity within the lung bases bilaterally. In the acute setting, this may reflect changes of superimposed atypical infection. Follow-up chest CT with contrast following a conservative therapy in 6 weeks would be helpful in confirming this and re-evaluating in the above findings. Thoracic multi disciplinary referral would also be helpful for further management. 5. Mild emphysema. 6. Extensive multi-vessel coronary artery calcification. 7. Aneurysmal dilation of the descending thoracic aorta measuring up to 3.9 cm in diameter. Recommend annual imaging followup by CTA or MRA. This recommendation follows 2010 ACCF/AHA/AATS/ACR/ASA/SCA/SCAI/SIR/STS/SVM Guidelines for the Diagnosis and Management of Patients with Thoracic Aortic Disease. Circulation.2010; 121: G269-S854. Aortic aneurysm NOS (ICD10-I71.9) Emphysema (ICD10-J43.9). Electronically Signed   By: Fidela Salisbury M.D.   On: 12/15/2021 01:32   CT Head Wo Contrast  Result Date: 12/14/2021 CLINICAL DATA:  Head trauma EXAM: CT HEAD WITHOUT CONTRAST TECHNIQUE: Contiguous axial images were obtained from the base of the skull through the vertex without intravenous contrast. RADIATION DOSE REDUCTION: This exam was performed according to the departmental dose-optimization program which includes automated exposure control, adjustment of the mA  and/or kV according to patient size and/or use of iterative reconstruction technique. COMPARISON:  None Available. FINDINGS: Brain: No acute territorial infarction, hemorrhage or intracranial mass. Atrophy. Patchy white matter hypodensity consistent with chronic small vessel ischemic change. Nonenlarged ventricles Vascular: No hyperdense vessels.  Carotid vascular calcification Skull: Normal. Negative for fracture or  focal lesion. Sinuses/Orbits: No acute finding. Other: None IMPRESSION: 1. No CT evidence for acute intracranial abnormality. 2. Atrophy and chronic small vessel ischemic changes of the white matter Electronically Signed   By: Donavan Foil M.D.   On: 12/14/2021 23:12   DG Chest Portable 1 View  Result Date: 12/14/2021 CLINICAL DATA:  New oxygen requirement EXAM: PORTABLE CHEST 1 VIEW COMPARISON:  None Available. FINDINGS: Diffuse mild interstitial opacity. Normal cardiomediastinal contours. No pleural effusion or pneumothorax. IMPRESSION: Mild interstitial pulmonary edema. Electronically Signed   By: Ulyses Jarred M.D.   On: 12/14/2021 22:43        Scheduled Meds:  aspirin EC  81 mg Oral Q48H   budesonide (PULMICORT) nebulizer solution  0.25 mg Nebulization BID   citalopram  20 mg Oral Daily   diltiazem  120 mg Oral Daily   enoxaparin (LOVENOX) injection  40 mg Subcutaneous Q24H   feeding supplement (GLUCERNA SHAKE)  237 mL Oral TID BM   furosemide  40 mg Intravenous Q12H   insulin aspart  0-9 Units Subcutaneous TID WC   ipratropium-albuterol  3 mL Nebulization Q6H   methylPREDNISolone (SOLU-MEDROL) injection  40 mg Intravenous Q12H   nicotine  21 mg Transdermal Daily   pravastatin  20 mg Oral q1800   rOPINIRole  1 mg Oral QHS   temazepam  15 mg Oral QHS     LOS: 1 day    Time spent: 35 minutes    Cherrise Occhipinti Darleen Crocker, DO [facility]  If 7PM-7AM, please contact night-coverage [email or web address] 12/16/2021, 10:10 AM

## 2021-12-16 NOTE — Progress Notes (Signed)
Patient ambulated while I was in the room to the bathroom with rolling walker.

## 2021-12-17 DIAGNOSIS — J441 Chronic obstructive pulmonary disease with (acute) exacerbation: Secondary | ICD-10-CM

## 2021-12-17 DIAGNOSIS — R918 Other nonspecific abnormal finding of lung field: Secondary | ICD-10-CM

## 2021-12-17 DIAGNOSIS — J9601 Acute respiratory failure with hypoxia: Secondary | ICD-10-CM

## 2021-12-17 DIAGNOSIS — Z720 Tobacco use: Secondary | ICD-10-CM

## 2021-12-17 LAB — GLUCOSE, CAPILLARY
Glucose-Capillary: 208 mg/dL — ABNORMAL HIGH (ref 70–99)
Glucose-Capillary: 123 mg/dL — ABNORMAL HIGH (ref 70–99)
Glucose-Capillary: 288 mg/dL — ABNORMAL HIGH (ref 70–99)
Glucose-Capillary: 205 mg/dL — ABNORMAL HIGH (ref 70–99)

## 2021-12-17 LAB — CBC
HCT: 38.1 % — ABNORMAL LOW (ref 39.0–52.0)
Hemoglobin: 12.7 g/dL — ABNORMAL LOW (ref 13.0–17.0)
MCH: 29.1 pg (ref 26.0–34.0)
MCHC: 33.3 g/dL (ref 30.0–36.0)
MCV: 87.4 fL (ref 80.0–100.0)
Platelets: 297 10*3/uL (ref 150–400)
RBC: 4.36 MIL/uL (ref 4.22–5.81)
RDW: 13.9 % (ref 11.5–15.5)
WBC: 13.7 10*3/uL — ABNORMAL HIGH (ref 4.0–10.5)
nRBC: 0 % (ref 0.0–0.2)

## 2021-12-17 LAB — BASIC METABOLIC PANEL
Anion gap: 12 (ref 5–15)
BUN: 30 mg/dL — ABNORMAL HIGH (ref 8–23)
CO2: 31 mmol/L (ref 22–32)
Calcium: 8.4 mg/dL — ABNORMAL LOW (ref 8.9–10.3)
Chloride: 95 mmol/L — ABNORMAL LOW (ref 98–111)
Creatinine, Ser: 0.85 mg/dL (ref 0.61–1.24)
GFR, Estimated: >60 mL/min (ref >60)
Glucose, Bld: 205 mg/dL — ABNORMAL HIGH (ref 70–99)
Potassium: 3.6 mmol/L (ref 3.5–5.1)
Sodium: 138 mmol/L (ref 135–145)

## 2021-12-17 LAB — SEDIMENTATION RATE: Sed Rate: 65 mm/hr — ABNORMAL HIGH (ref 0–16)

## 2021-12-17 LAB — CRYPTOCOCCAL ANTIGEN: Crypto Ag: NEGATIVE

## 2021-12-17 LAB — PSA: Prostatic Specific Antigen: 2.55 ng/mL (ref 0.00–4.00)

## 2021-12-17 LAB — SARS CORONAVIRUS 2 BY RT PCR: SARS Coronavirus 2 by RT PCR: POSITIVE — AB

## 2021-12-17 LAB — MAGNESIUM: Magnesium: 2.1 mg/dL (ref 1.7–2.4)

## 2021-12-17 MED ORDER — IPRATROPIUM-ALBUTEROL 0.5-2.5 (3) MG/3ML IN SOLN
3.0000 mL | Freq: Four times a day (QID) | RESPIRATORY_TRACT | Status: DC
  Administered 2021-12-17: 3 mL via RESPIRATORY_TRACT
  Filled 2021-12-17: qty 3

## 2021-12-17 MED ORDER — ZINC SULFATE 220 (50 ZN) MG PO CAPS
220.0000 mg | ORAL_CAPSULE | Freq: Every day | ORAL | Status: DC
Start: 2021-12-17 — End: 2021-12-20
  Administered 2021-12-17 – 2021-12-20 (×4): 220 mg via ORAL
  Filled 2021-12-17 (×4): qty 1

## 2021-12-17 MED ORDER — INSULIN GLARGINE-YFGN 100 UNIT/ML ~~LOC~~ SOLN
5.0000 [IU] | Freq: Every day | SUBCUTANEOUS | Status: DC
Start: 2021-12-17 — End: 2021-12-18
  Administered 2021-12-17 – 2021-12-18 (×2): 5 [IU] via SUBCUTANEOUS
  Filled 2021-12-17 (×3): qty 0.05

## 2021-12-17 MED ORDER — NIRMATRELVIR/RITONAVIR (PAXLOVID)TABLET
3.0000 | ORAL_TABLET | Freq: Two times a day (BID) | ORAL | Status: DC
  Administered 2021-12-17 – 2021-12-20 (×7): 3 via ORAL
  Filled 2021-12-17: qty 30

## 2021-12-17 MED ORDER — GUAIFENESIN-DM 100-10 MG/5ML PO SYRP: 10.0000 mL | ORAL_SOLUTION | ORAL | Status: DC | PRN

## 2021-12-17 MED ORDER — AMOXICILLIN-POT CLAVULANATE 875-125 MG PO TABS
1.0000 | ORAL_TABLET | Freq: Two times a day (BID) | ORAL | Status: DC
Start: 2021-12-17 — End: 2021-12-20
  Administered 2021-12-17 – 2021-12-20 (×7): 1 via ORAL
  Filled 2021-12-17 (×7): qty 1

## 2021-12-17 MED ORDER — ASCORBIC ACID 500 MG PO TABS
500.0000 mg | ORAL_TABLET | Freq: Every day | ORAL | Status: DC
  Administered 2021-12-17 – 2021-12-20 (×4): 500 mg via ORAL
  Filled 2021-12-17 (×4): qty 1

## 2021-12-17 MED ORDER — ALBUTEROL SULFATE HFA 108 (90 BASE) MCG/ACT IN AERS
2.0000 | INHALATION_SPRAY | Freq: Four times a day (QID) | RESPIRATORY_TRACT | Status: DC
  Administered 2021-12-17: 2 via RESPIRATORY_TRACT
  Filled 2021-12-17: qty 6.7

## 2021-12-17 MED ORDER — ALBUTEROL SULFATE HFA 108 (90 BASE) MCG/ACT IN AERS
2.0000 | INHALATION_SPRAY | Freq: Two times a day (BID) | RESPIRATORY_TRACT | Status: DC
  Administered 2021-12-18 – 2021-12-20 (×5): 2 via RESPIRATORY_TRACT

## 2021-12-17 MED ORDER — ALBUTEROL SULFATE HFA 108 (90 BASE) MCG/ACT IN AERS
2.0000 | INHALATION_SPRAY | Freq: Four times a day (QID) | RESPIRATORY_TRACT | Status: DC | PRN
Start: 2021-12-17 — End: 2021-12-20

## 2021-12-17 MED ORDER — HYDROCOD POLI-CHLORPHE POLI ER 10-8 MG/5ML PO SUER
5.0000 mL | Freq: Two times a day (BID) | ORAL | Status: DC | PRN
  Administered 2021-12-17: 5 mL via ORAL
  Filled 2021-12-17: qty 5

## 2021-12-17 NOTE — Progress Notes (Signed)
SATURATION QUALIFICATIONS: (This note is used to comply with regulatory documentation for home oxygen)  Patient Saturations on Room Air at Rest = 86%  Patient Saturations on Room Air while Ambulating = N/A  Patient Saturations on 5 Liters of oxygen while Ambulating = 90%  Please briefly explain why patient needs home oxygen:

## 2021-12-17 NOTE — Consult Note (Signed)
NAME:  Nathan Turner, MRN:  [id number], DOB:  1945-08-05, LOS: 2 ADMISSION DATE:  12/14/2021, CONSULTATION DATE:  12/17/21 REFERRING MD:  Manuella Ghazi, CHIEF COMPLAINT:  lung nodules/aecopd    History of Present Illness:  21 yowm active smoker with medical history significant of T2DM, essential hypertension, hyperlipidemia, insomnia,  who presented to the emergency department with complaint of altered mental status.  Patient  lost his wife about 3 weeks PTA and was at home with his stepson.  2 days PTA stepson went out of town to take care of some affairs regarding patient and late wife's property, so, patient was left at home by himself.  Patient had a fall at home 2 days PTA and sustained an abrasion of the head, stepson called about 4 PM  one day PTA and the patient was acting is normal self, but on day of admit there was a noticeable change in patient's mental status due to not acting his normal self per patient stepson, so he was taken to the ED for further evaluation and management.  There was no report  of fever, chills, nausea, vomiting, abdominal pain.  W/u Pos for aecopd and at least 2 lung nodules on CT and PCCM asked to eval 8/28. Additonal hx = chronically discolored mucus turned darker brown last week PTA, wt loss ? Baseline but no active sinus symptoms, dysyphagia, dental work or dysphagia/ pleuritic cp       Significant Hospital Events: Including procedures, antibiotic start and stop dates in addition to other pertinent events   CTa chest 12/15/21  1. No pulmonary embolism. 2. Spiculated mixed solid and cystic mass within the right lower lobe suspicious for a primary bronchogenic malignancy. Associated mediastinal and hilar adenopathy is suspicious for nodal metastatic disease though a component of reactive change may be present. 3. Additional spiculated nodule within the right upper lobe suspicious for a metachronous primary or intrapulmonary metastasis. 4. Widespread centrilobular  and peribronchial nodularity with more extensive consolidative opacity within the lung bases bilaterally. In the acute setting, this may reflect changes of superimposed atypical infection. Follow-up chest CT with contrast following a conservative therapy in 6 weeks would be helpful in confirming this and re-evaluating in the above findings. Thoracic multi disciplinary referral would also be helpful for further management. 5. Mild emphysema.  Scheduled Meds:  aspirin EC  81 mg Oral Q48H   budesonide (PULMICORT) nebulizer solution  0.25 mg Nebulization BID   citalopram  20 mg Oral Daily   diltiazem  120 mg Oral Daily   enoxaparin (LOVENOX) injection  40 mg Subcutaneous Q24H   feeding supplement (GLUCERNA SHAKE)  237 mL Oral TID BM   insulin aspart  0-9 Units Subcutaneous TID WC   insulin glargine-yfgn  5 Units Subcutaneous Daily   ipratropium-albuterol  3 mL Nebulization Q6H WA   methylPREDNISolone (SOLU-MEDROL) injection  40 mg Intravenous Q12H   nicotine  21 mg Transdermal Daily   pravastatin  20 mg Oral q1800   rOPINIRole  0.5 mg Oral QHS   temazepam  15 mg Oral QHS   Continuous Infusions: PRN Meds:.acetaminophen **OR** acetaminophen, ALPRAZolam, guaiFENesin-dextromethorphan, haloperidol lactate, traMADol   Interim History / Subjective:  Wants to go home, no cp or resting sob on 02   Objective   Blood pressure (!) 149/76, pulse 62, temperature 98 F (36.7 C), resp. rate 16, height 5' 3"  (1.6 m), weight 61.5 kg, SpO2 95 %.        Intake/Output Summary (Last 24 hours) at  12/17/2021 1308 Last data filed at 12/17/2021 1046 Gross per 24 hour  Intake 480 ml  Output 2000 ml  Net -1520 ml   Filed Weights   12/14/21 1828 12/14/21 1854 12/17/21 0500  Weight: 65.3 kg 65.3 kg 61.5 kg    Examination: Tmax:  98 General appearance:    more chronically ill than acute   At Rest 02 sats  95% on 6lpm   No jvd Oropharynx clear,  mucosa nl/ edentulous Neck supple Lungs with a few  scattered exp > insp rhonchi bilaterally RRR no s3 or or sign murmur Abd soft with l excursion  Extr warm with no edema or clubbing noted Neuro  Sensorium intact,  no apparent motor deficits   Assessment & Plan:  1)  Aecopd with purulent tracheobronchitis in active smoker  Rec duoneb/ steroids/ abx = augmentin as several areas on cxr appear cystic   2) underlying copd with mild emphysema on CT but not really limited form desired activities by breathing and no baseline pfts available  >>> rx duoneb and d/c on breztri or trelegy depending on insurance formularies   3) multiple lung nodules some with cavities only vz on CT  >>> check anca, esr, RA, cryto ab and histo ag,  quant TB, CEA, PSA and f/u  p 10 d augmetin in pulmonary office to arrnge possbile PET then ? Navigational bx if indicated   4) new hypoxemic resp failure > titrate to sats > 90%  >>> f/u with outpt   5) Active smoker Counseled re importance of smoking cessation        Labs   CBC: Recent Labs  Lab 12/14/21 1850 12/15/21 0602 12/16/21 0557 12/17/21 0416  WBC 12.4* 11.2* 13.1* 13.7*  HGB 12.1* 12.2* 12.2* 12.7*  HCT 36.9* 36.2* 36.3* 38.1*  MCV 88.9 87.7 87.3 87.4  PLT 244 249 260 673    Basic Metabolic Panel: Recent Labs  Lab 12/14/21 1850 12/15/21 0602 12/16/21 0557 12/17/21 0416  NA 139 140 137 138  K 4.5 3.9 4.0 3.6  CL 101 101 99 95*  CO2 27 32 29 31  GLUCOSE 188* 152* 231* 205*  BUN 43* 32* 24* 30*  CREATININE 1.20 0.89 0.76 0.85  CALCIUM 8.8* 8.6* 8.2* 8.4*  MG  --   --  2.2 2.1   GFR: Estimated Creatinine Clearance: 57.6 mL/min (by C-G formula based on SCr of 0.85 mg/dL). Recent Labs  Lab 12/14/21 1850 12/15/21 0602 12/16/21 0557 12/17/21 0416  WBC 12.4* 11.2* 13.1* 13.7*    Liver Function Tests: Recent Labs  Lab 12/14/21 1850 12/15/21 0602  AST 30 36  ALT 50* 50*  ALKPHOS 87 86  BILITOT 1.0 0.9  PROT 7.3 6.5  ALBUMIN 3.4* 2.9*   No results for input(s): "LIPASE",  "AMYLASE" in the last 168 hours. No results for input(s): "AMMONIA" in the last 168 hours.  ABG No results found for: "PHART", "PCO2ART", "PO2ART", "HCO3", "TCO2", "ACIDBASEDEF", "O2SAT"   Coagulation Profile: No results for input(s): "INR", "PROTIME" in the last 168 hours.  Cardiac Enzymes: No results for input(s): "CKTOTAL", "CKMB", "CKMBINDEX", "TROPONINI" in the last 168 hours.  HbA1C: Hgb A1c MFr Bld  Date/Time Value Ref Range Status  12/14/2021 06:50 PM 6.4 (H) 4.8 - 5.6 % Final    Comment:    (NOTE) Pre diabetes:          5.7%-6.4%  Diabetes:              >6.4%  Glycemic  control for   <7.0% adults with diabetes   05/18/2019 11:53 AM 6.2 (H) 4.8 - 5.6 % Final    Comment:    (NOTE) Pre diabetes:          5.7%-6.4% Diabetes:              >6.4% Glycemic control for   <7.0% adults with diabetes     CBG: Recent Labs  Lab 12/15/21 2143 12/16/21 0731 12/16/21 1107 12/16/21 1632 12/16/21 2034  GLUCAP 161* 235* 242* 214* 252*      Past Medical History:  He,  has a past medical history of Amputation of left index finger, Anxiety, Arthritis, Complication of anesthesia, Hypertension, and Pre-diabetes.   Surgical History:   Past Surgical History:  Procedure Laterality Date   AMPUTATION Left 11/16/2019   Procedure: REVISION AMPUTATION LEFT INDEX FINGER;  Surgeon: Leanora Cover, MD;  Location: [place];  Service: Orthopedics;  Laterality: Left;   CERVICAL SPINE SURGERY     COLONOSCOPY     TONSILLECTOMY       Social History:   reports that he has been smoking cigars. He has a 64.00 pack-year smoking history. He has never used smokeless tobacco. He reports that he does not drink alcohol and does not use drugs.   Family History:  His family history is not on file.   Allergies No Known Allergies   Home Medications  Prior to Admission medications   Medication Sig Start Date End Date Taking? Authorizing Provider  aspirin EC 81 MG tablet Take 81  mg by mouth every other day.    Yes [provider]  citalopram (CELEXA) 20 MG tablet Take 20 mg by mouth daily.   Yes [provider]  diltiazem (CARTIA XT) 120 MG 24 hr capsule Take 120 mg by mouth daily.   Yes [provider]  lovastatin (MEVACOR) 20 MG tablet Take 20 mg by mouth daily.    Yes [provider]  rOPINIRole (REQUIP) 0.5 MG tablet Take 0.5 mg by mouth at bedtime. 08/23/21  Yes [provider]  temazepam (RESTORIL) 15 MG capsule Take 15 mg by mouth at bedtime.   Yes [provider]  traMADol (ULTRAM) 50 MG tablet Take by mouth every 6 (six) hours as needed.   Yes [provider]  cephALEXin (KEFLEX) 500 MG capsule Take 500 mg by mouth 4 (four) times daily.    [provider]  MODERNA COVID-19 BIVALENT 50 MCG/0.5ML injection  08/20/21   [provider]  Omega-3 Fatty Acids (FISH OIL) 1200 MG CAPS Take 1,200 mg by mouth daily.    [provider]  rOPINIRole (REQUIP) 1 MG tablet Take 1 mg by mouth at bedtime. Patient not taking: Reported on 12/16/2021    [provider]       Christinia Gully, MD [facility] [place] [phone]   After 7:00 pm call Elink  [phone]

## 2021-12-17 NOTE — Inpatient Diabetes Management (Signed)
Inpatient Diabetes Program Recommendations  AACE/ADA: New Consensus Statement on Inpatient Glycemic Control   Target Ranges:  Prepandial:   less than 140 mg/dL      Peak postprandial:   less than 180 mg/dL (1-2 hours)      Critically ill patients:  140 - 180 mg/dL     Latest Reference Range & Units 12/16/21 07:31 12/16/21 11:07 12/16/21 16:32 12/16/21 20:34  Glucose-Capillary 70 - 99 mg/dL 235 (H) 242 (H) 214 (H) 252 (H)    Latest Reference Range & Units 12/14/21 18:50  Hemoglobin A1C 4.8 - 5.6 % 6.4 (H)   Review of Glycemic Control  Diabetes history: DM2 Outpatient Diabetes medications: None Current orders for Inpatient glycemic control: Novolog 0-9 units TID with meals; Solumedrol 40 mg Q12H  Inpatient Diabetes Program Recommendations:    Insulin: If steroids are continued as ordered, please consider ordering Semglee 5 units daily.    Thanks, Barnie Alderman, RN, MSN, [place] [facility] Coordinator Inpatient Diabetes Program [phone] (Team Pager from 8am to [place])

## 2021-12-17 NOTE — Care Management Important Message (Signed)
Important Message  Patient Details  Name: Nathan Turner MRN: [id number] Date of Birth: 1945/11/12   Medicare Important Message Given:  N/A - LOS <3 / Initial given by admissions     Tommy Medal 12/17/2021, 2:53 PM

## 2021-12-17 NOTE — Progress Notes (Signed)
PROGRESS NOTE    Nathan Turner  GXQ:[id number] DOB: 09-04-1945 DOA: 12/14/2021 PCP: Glenda Chroman, MD   Brief Narrative:    Nathan Turner is a 76 y.o. male with medical history significant of T2DM, essential hypertension, hyperlipidemia, insomnia, tobacco abuse who presents to the emergency department with complaint of altered mental status.  He has also had a fall at home and was admitted with acute encephalopathy in the setting of hypoxemic respiratory failure.  He is noted to have a new right lower lobe mass on CT chest.  There is no finding of PE and he does have some interstitial edema and what appears to be a flareup of COPD along with some acute CHF.  Assessment & Plan:   Principal Problem:   Acute respiratory failure with hypoxia (HCC) Active Problems:   Altered mental status   Interstitial edema   Leukocytosis   Hypoalbuminemia due to protein-calorie malnutrition (HCC)   Tobacco abuse   Elevated d-dimer   Fall at home, initial encounter   Uncontrolled type 2 diabetes mellitus with hyperglycemia (HCC)   Essential hypertension   Mixed hyperlipidemia   Descending aortic aneurysm (HCC)   Multiple pulmonary nodules determined by computed tomography of lung   Acute hypercapnic respiratory failure (HCC)   COPD with acute exacerbation (HCC)  Assessment and Plan:   Acute respiratory failure with hypoxia-multifactorial with COVID-19 infection Patient was requiring supplemental oxygen to maintain normal O2 sats.  Shortness of breath started after sustaining a fall at home 2 days ago Appears to be related to COVID-19 infection with possible superimposed bacterial pneumonia Lasix already given, patient appears euvolemic, discontinue further Lasix Continue Solu-Medrol and albuterol inhaler Contact precautions and started on Paxlovid 8/28 Augmentin as recommended by pulmonology Continue incentive telemetry and flutter valve Continue supplemental oxygen to maintain O2 sats >  94% with plan to wean patient off supplemental oxygen as tolerated.   Acute encephalopathy secondary to above-resolved Patient has periods of agitation and confusion for which Haldol ordered prn CT head NAD Continue to monitor patient   Acute mild interstitial edema Chest x-ray showed mild interstitial edema IV Lasix 40 mg twice daily now discontinued 2D echocardiogram with LVEF 50-55% and grade 1 diastolic dysfunction   Right lower lobe mass CT angiography of chest showed spiculated mixed solid and cystic mass within the right lower lobe suspicious for a primary bronchogenic malignancy. Associated mediastinal and hilar adenopathy is suspicious for nodal metastatic disease. Appreciate pulmonology evaluation with labs ordered with noted multiple lung nodules and cavitary lesions   Elevated D-dimer D-dimer 1.34, CT angiography of chest showed no pulmonary embolism   Fall at home Continue fall precaution and neuro precaution PT recommendations for home health and walker   Thoracic descending aneurysm CT angiography of chest showed aneurysmal dilation of the descending thoracic aorta measuring up to 3.9 cm in diameter.  Annual imaging follow-up by CT or MRI recommended by radiologist   Leukocytosis possibly reactive WBC 12.4, continue to monitor WBC with morning labs   Hypoalbuminemia possibly secondary to mild protein calorie malnutrition Albumin 3.4, protein supplement to be given   T2DM with hyperglycemia CBG 188, patient was not on any diabetic medication at home Hemoglobin A1c 6.4% Continue ISS and hypoglycemia protocol   Essential hypertension Continue diltiazem   Mixed hyperlipidemia Continue lovastatin and Lovaza   Insomnia Continue Restoril per home regimen   Tobacco abuse Patient was counseled on tobacco abuse cessation     DVT prophylaxis: Lovenox Code Status:  Full Family Communication: Discussed with son on phone 8/28 Disposition Plan:  Status is:  Inpatient Remains inpatient appropriate because: Need for IV medications   Consultants:  Pulmonology   Procedures:  See below   Antimicrobials:  None    Subjective: Patient seen and evaluated today with no new acute complaints or concerns. No acute concerns or events noted overnight.  He continues to have high oxygen requirements and is eager to go home when able.  Objective: Vitals:   12/16/21 2039 12/17/21 0441 12/17/21 0500 12/17/21 0706  BP: (!) 122/52 (!) 149/76    Pulse: 78 62    Resp: (!) 21 16    Temp: (!) 97.5 F (36.4 C) 98 F (36.7 C)    TempSrc:      SpO2: 92% 92%  95%  Weight:   61.5 kg   Height:        Intake/Output Summary (Last 24 hours) at 12/17/2021 1349 Last data filed at 12/17/2021 1046 Gross per 24 hour  Intake 480 ml  Output 2000 ml  Net -1520 ml   Filed Weights   12/14/21 1828 12/14/21 1854 12/17/21 0500  Weight: 65.3 kg 65.3 kg 61.5 kg    Examination:  General exam: Appears calm and comfortable  Respiratory system: Clear to auscultation. Respiratory effort normal.  6 L nasal cannula oxygen Cardiovascular system: S1 & S2 heard, RRR.  Gastrointestinal system: Abdomen is soft Central nervous system: Alert and awake Extremities: No edema Skin: No significant lesions noted Psychiatry: Flat affect.    Data Reviewed: I have personally reviewed following labs and imaging studies  CBC: Recent Labs  Lab 12/14/21 1850 12/15/21 0602 12/16/21 0557 12/17/21 0416  WBC 12.4* 11.2* 13.1* 13.7*  HGB 12.1* 12.2* 12.2* 12.7*  HCT 36.9* 36.2* 36.3* 38.1*  MCV 88.9 87.7 87.3 87.4  PLT 244 249 260 595   Basic Metabolic Panel: Recent Labs  Lab 12/14/21 1850 12/15/21 0602 12/16/21 0557 12/17/21 0416  NA 139 140 137 138  K 4.5 3.9 4.0 3.6  CL 101 101 99 95*  CO2 27 32 29 31  GLUCOSE 188* 152* 231* 205*  BUN 43* 32* 24* 30*  CREATININE 1.20 0.89 0.76 0.85  CALCIUM 8.8* 8.6* 8.2* 8.4*  MG  --   --  2.2 2.1   GFR: Estimated Creatinine  Clearance: 57.6 mL/min (by C-G formula based on SCr of 0.85 mg/dL). Liver Function Tests: Recent Labs  Lab 12/14/21 1850 12/15/21 0602  AST 30 36  ALT 50* 50*  ALKPHOS 87 86  BILITOT 1.0 0.9  PROT 7.3 6.5  ALBUMIN 3.4* 2.9*   No results for input(s): "LIPASE", "AMYLASE" in the last 168 hours. No results for input(s): "AMMONIA" in the last 168 hours. Coagulation Profile: No results for input(s): "INR", "PROTIME" in the last 168 hours. Cardiac Enzymes: No results for input(s): "CKTOTAL", "CKMB", "CKMBINDEX", "TROPONINI" in the last 168 hours. BNP (last 3 results) No results for input(s): "PROBNP" in the last 8760 hours. HbA1C: Recent Labs    12/14/21 1850  HGBA1C 6.4*   CBG: Recent Labs  Lab 12/15/21 2143 12/16/21 0731 12/16/21 1107 12/16/21 1632 12/16/21 2034  GLUCAP 161* 235* 242* 214* 252*   Lipid Profile: No results for input(s): "CHOL", "HDL", "LDLCALC", "TRIG", "CHOLHDL", "LDLDIRECT" in the last 72 hours. Thyroid Function Tests: No results for input(s): "TSH", "T4TOTAL", "FREET4", "T3FREE", "THYROIDAB" in the last 72 hours. Anemia Panel: No results for input(s): "VITAMINB12", "FOLATE", "FERRITIN", "TIBC", "IRON", "RETICCTPCT" in the last 72 hours. Sepsis  Labs: No results for input(s): "PROCALCITON", "LATICACIDVEN" in the last 168 hours.  Recent Results (from the past 240 hour(s))  SARS Coronavirus 2 by RT PCR (hospital order, performed in [facility] hospital lab) *cepheid single result test* Anterior Nasal Swab     Status: Abnormal   Collection Time: 12/17/21 12:55 PM   Specimen: Anterior Nasal Swab  Result Value Ref Range Status   SARS Coronavirus 2 by RT PCR POSITIVE (A) NEGATIVE Final    Comment: (NOTE) SARS-CoV-2 target nucleic acids are DETECTED  SARS-CoV-2 RNA is generally detectable in upper respiratory specimens  during the acute phase of infection.  Positive results are indicative  of the presence of the identified virus, but do not rule  out bacterial infection or co-infection with other pathogens not detected by the test.  Clinical correlation with patient history and  other diagnostic information is necessary to determine patient infection status.  The expected result is negative.  Fact Sheet for Patients:   https://www.patel.info/   Fact Sheet for Healthcare Providers:   https://hall.com/    This test is not yet approved or cleared by the [place] FDA and  has been authorized for detection and/or diagnosis of SARS-CoV-2 by FDA under an Emergency Use Authorization (EUA).  This EUA will remain in effect (meaning this test can be used) for the duration of  the COVID-19 declaration under Section 564(b)(1)  of the Act, 21 U.S.C. section 360-bbb-3(b)(1), unless the authorization is terminated or revoked sooner.   Performed at [facility], [address], [place], [place] [address]          Radiology Studies: ECHOCARDIOGRAM COMPLETE  Result Date: 12/16/2021    ECHOCARDIOGRAM REPORT   Patient Name:   JARRIN STALEY Date of Exam: 12/16/2021 Medical Rec #:  [id number]        Height:       63.0 in Accession #:    [id number]       Weight:       144.0 lb Date of Birth:  1945/08/20         BSA:          1.682 m Patient Age:    50 years         BP:           149/80 mmHg Patient Gender: M                HR:           68 bpm. Exam Location:  [facility] Procedure: 2D Echo, Color Doppler and Cardiac Doppler Indications:    X10.62 Acute diastolic (congestive) heart failure  History:        Patient has no prior history of Echocardiogram examinations.                 Risk Factors:Hypertension and Diabetes.  Sonographer:    Raquel Sarna Senior RDCS Referring Phys: [id number] [place]  1. Left ventricular ejection fraction, by estimation, is 50 to 55%. Left ventricular ejection fraction by PLAX is 51 %. The left ventricle has low normal function. The left ventricle has no regional wall  motion abnormalities. Left ventricular diastolic parameters are consistent with Grade I diastolic dysfunction (impaired relaxation).  2. Right ventricular systolic function is mildly reduced. The right ventricular size is normal. Tricuspid regurgitation signal is inadequate for assessing PA pressure.  3. The mitral valve is grossly normal. Trivial mitral valve regurgitation.  4. The aortic valve is tricuspid. Aortic valve  regurgitation is not visualized.  5. The inferior vena cava is dilated in size with <50% respiratory variability, suggesting right atrial pressure of 15 mmHg. Comparison(s): No prior Echocardiogram. FINDINGS  Left Ventricle: Left ventricular ejection fraction, by estimation, is 50 to 55%. Left ventricular ejection fraction by PLAX is 51 %. The left ventricle has low normal function. The left ventricle has no regional wall motion abnormalities. The left ventricular internal cavity size was normal in size. There is no left ventricular hypertrophy. Left ventricular diastolic parameters are consistent with Grade I diastolic dysfunction (impaired relaxation). Normal left ventricular filling pressure. Right Ventricle: The right ventricular size is normal. No increase in right ventricular wall thickness. Right ventricular systolic function is mildly reduced. Tricuspid regurgitation signal is inadequate for assessing PA pressure. Left Atrium: Left atrial size was normal in size. Right Atrium: Right atrial size was normal in size. Pericardium: There is no evidence of pericardial effusion. Mitral Valve: The mitral valve is grossly normal. Trivial mitral valve regurgitation. Tricuspid Valve: The tricuspid valve is normal in structure. Tricuspid valve regurgitation is not demonstrated. Aortic Valve: The aortic valve is tricuspid. Aortic valve regurgitation is not visualized. Pulmonic Valve: The pulmonic valve was normal in structure. Pulmonic valve regurgitation is not visualized. Aorta: The aortic root is  normal in size and structure and the ascending aorta was not well visualized. Venous: The inferior vena cava is dilated in size with less than 50% respiratory variability, suggesting right atrial pressure of 15 mmHg. IAS/Shunts: No atrial level shunt detected by color flow Doppler.  LEFT VENTRICLE PLAX 2D LV EF:         Left            Diastology                ventricular     LV e' medial:    6.85 cm/s                ejection        LV E/e' medial:  8.1                fraction by     LV e' lateral:   9.46 cm/s                PLAX is 51      LV E/e' lateral: 5.9                %. LVIDd:         5.00 cm LVIDs:         3.70 cm LV PW:         0.80 cm LV IVS:        0.90 cm LVOT diam:     2.20 cm LV SV:         82 LV SV Index:   49 LVOT Area:     3.80 cm  RIGHT VENTRICLE RV S prime:     8.81 cm/s TAPSE (M-mode): 1.9 cm LEFT ATRIUM             Index        RIGHT ATRIUM           Index LA diam:        3.40 cm 2.02 cm/m   RA Area:     14.60 cm LA Vol (A2C):   46.6 ml 27.71 ml/m  RA Volume:   35.10 ml  20.87 ml/m LA Vol (A4C):   45.4 ml 27.00 ml/m  LA Biplane Vol: 46.3 ml 27.53 ml/m  AORTIC VALVE LVOT Vmax:   97.90 cm/s LVOT Vmean:  67.500 cm/s LVOT VTI:    0.216 m  AORTA Ao Root diam: 3.40 cm MITRAL VALVE MV Area (PHT): 2.33 cm    SHUNTS MV Decel Time: 325 msec    Systemic VTI:  0.22 m MV E velocity: 55.70 cm/s  Systemic Diam: 2.20 cm MV A velocity: 75.00 cm/s MV E/A ratio:  0.74 Lyman Bishop MD Electronically signed by Lyman Bishop MD Signature Date/Time: 12/16/2021/12:16:43 PM    Final         Scheduled Meds:  albuterol  2 puff Inhalation Q6H   amoxicillin-clavulanate  1 tablet Oral Q12H   vitamin C  500 mg Oral Daily   aspirin EC  81 mg Oral Q48H   citalopram  20 mg Oral Daily   diltiazem  120 mg Oral Daily   enoxaparin (LOVENOX) injection  40 mg Subcutaneous Q24H   feeding supplement (GLUCERNA SHAKE)  237 mL Oral TID BM   insulin aspart  0-9 Units Subcutaneous TID WC   insulin glargine-yfgn  5  Units Subcutaneous Daily   methylPREDNISolone (SOLU-MEDROL) injection  40 mg Intravenous Q12H   nicotine  21 mg Transdermal Daily   nirmatrelvir/ritonavir EUA  3 tablet Oral BID   pravastatin  20 mg Oral q1800   rOPINIRole  0.5 mg Oral QHS   temazepam  15 mg Oral QHS   zinc sulfate  220 mg Oral Daily     LOS: 2 days    Time spent: 35 minutes    Basilio Meadow Darleen Crocker, DO [facility]  If 7PM-7AM, please contact night-coverage [email or web address] 12/17/2021, 1:49 PM

## 2021-12-17 NOTE — Evaluation (Signed)
Occupational Therapy Evaluation Patient Details Name: Nathan Turner MRN: [id number] DOB: 05-Apr-1946 Today's Date: 12/17/2021   History of Present Illness 76 y.o. M admitted on 12/14/21 due to AMS and fall. CT head negative. Pt being worked up for hypoxia.  PMH significant for  T2DM, essential hypertension, hyperlipidemia, insomnia, tobacco abuse.   Clinical Impression   Pt admitted for concerns listed above. PTA pt reported that he was independent with all ADL's and IADL's, including driving. At this time, pt presents with increased O2 needs, weakness, balance deficits, and decreased activity tolerance. He is overall at a min guard to min A level, requiring use of RW. His O2 sats maintain relatively stable on 4.5L of O2 throughout session. Recommending HHOT to maximize independence and safety. OT will follow acutely.       Recommendations for follow up therapy are one component of a multi-disciplinary discharge planning process, led by the attending physician.  Recommendations may be updated based on patient status, additional functional criteria and insurance authorization.   Follow Up Recommendations  Home health OT    Assistance Recommended at Discharge Set up Supervision/Assistance  Patient can return home with the following A little help with walking and/or transfers;A little help with bathing/dressing/bathroom;Assistance with cooking/housework;Help with stairs or ramp for entrance    Functional Status Assessment  Patient has had a recent decline in their functional status and demonstrates the ability to make significant improvements in function in a reasonable and predictable amount of time.  Equipment Recommendations  None recommended by OT    Recommendations for Other Services       Precautions / Restrictions Precautions Precautions: Fall Restrictions Weight Bearing Restrictions: No      Mobility Bed Mobility Overal bed mobility: Modified Independent                   Transfers Overall transfer level: Needs assistance Equipment used: Rolling walker (2 wheels) Transfers: Sit to/from Stand Sit to Stand: Min guard           General transfer comment: Min guard for safety and increased time.      Balance Overall balance assessment: Mild deficits observed, not formally tested                                         ADL either performed or assessed with clinical judgement   ADL Overall ADL's : Needs assistance/impaired Eating/Feeding: Set up;Sitting   Grooming: Min guard;Standing   Upper Body Bathing: Set up;Sitting   Lower Body Bathing: Minimal assistance;Sitting/lateral leans;Sit to/from stand   Upper Body Dressing : Set up;Sitting   Lower Body Dressing: Minimal assistance;Sitting/lateral leans;Sit to/from stand   Toilet Transfer: Minimal assistance;Ambulation   Toileting- Clothing Manipulation and Hygiene: Minimal assistance;Sitting/lateral lean;Sit to/from stand       Functional mobility during ADLs: Minimal assistance;Rolling walker (2 wheels)       Vision Baseline Vision/History: 1 Wears glasses Ability to See in Adequate Light: 0 Adequate Patient Visual Report: No change from baseline Vision Assessment?: No apparent visual deficits     Perception     Praxis      Pertinent Vitals/Pain Pain Assessment Pain Assessment: No/denies pain     Hand Dominance Right   Extremity/Trunk Assessment Upper Extremity Assessment Upper Extremity Assessment: Overall WFL for tasks assessed   Lower Extremity Assessment Lower Extremity Assessment: Defer to PT evaluation   Cervical /  Trunk Assessment Cervical / Trunk Assessment: Normal   Communication Communication Communication: No difficulties   Cognition Arousal/Alertness: Awake/alert Behavior During Therapy: WFL for tasks assessed/performed Overall Cognitive Status: No family/caregiver present to determine baseline cognitive functioning                                  General Comments: Appears near baseline, follows all commands, good safety awareness, good recall of previous hospital events.     General Comments  Standing at sink spO2 89-90% on 4.5L, back in bed at rest spO2 93% on 4.5L    Exercises     Shoulder Instructions      Home Living Family/patient expects to be discharged to:: Private residence Living Arrangements: Alone Available Help at Discharge: Family Type of Home: House Home Access: Ramped entrance     Home Layout: One level     Bathroom Shower/Tub: Teacher, early years/pre: Standard     Home Equipment: BSC/3in1;Shower seat;Grab bars - toilet;Grab bars - tub/shower          Prior Functioning/Environment Prior Level of Function : Independent/Modified Independent             Mobility Comments: No AD ADLs Comments: Indep        OT Problem List: Decreased strength;Decreased activity tolerance;Impaired balance (sitting and/or standing);Cardiopulmonary status limiting activity      OT Treatment/Interventions: Self-care/ADL training;Therapeutic exercise;Energy conservation;DME and/or AE instruction;Therapeutic activities;Patient/family education;Balance training    OT Goals(Current goals can be found in the care plan section) Acute Rehab OT Goals Patient Stated Goal: To go home OT Goal Formulation: With patient Time For Goal Achievement: 12/31/21 Potential to Achieve Goals: Good ADL Goals Pt Will Perform Grooming: with modified independence;standing Pt Will Perform Lower Body Bathing: with modified independence;sitting/lateral leans;sit to/from stand Pt Will Perform Lower Body Dressing: with modified independence;sitting/lateral leans;sit to/from stand Pt Will Transfer to Toilet: with modified independence;ambulating Pt Will Perform Toileting - Clothing Manipulation and hygiene: with modified independence;sitting/lateral leans;sit to/from stand  OT Frequency: Min  1X/week    Co-evaluation              AM-PAC OT "6 Clicks" Daily Activity     Outcome Measure Help from another person eating meals?: A Little Help from another person taking care of personal grooming?: A Little Help from another person toileting, which includes using toliet, bedpan, or urinal?: A Little Help from another person bathing (including washing, rinsing, drying)?: A Little Help from another person to put on and taking off regular upper body clothing?: A Little Help from another person to put on and taking off regular lower body clothing?: A Little 6 Click Score: 18   End of Session Equipment Utilized During Treatment: Gait belt;Rolling walker (2 wheels);Oxygen Nurse Communication: Mobility status  Activity Tolerance: Patient tolerated treatment well Patient left: in bed;with call bell/phone within reach;with bed alarm set  OT Visit Diagnosis: Unsteadiness on feet (R26.81);Other abnormalities of gait and mobility (R26.89);Muscle weakness (generalized) (M62.81)                Time: 7824-2353 OT Time Calculation (min): 21 min Charges:  OT General Charges $OT Visit: 1 Visit OT Evaluation $OT Eval Moderate Complexity: [place], OTR/L  [place] Elane Yolanda Bonine 12/17/2021, 11:26 AM

## 2021-12-18 DIAGNOSIS — J9601 Acute respiratory failure with hypoxia: Secondary | ICD-10-CM | POA: Diagnosis not present

## 2021-12-18 LAB — CBC WITH DIFFERENTIAL/PLATELET
Abs Immature Granulocytes: 0.07 10*3/uL (ref 0.00–0.07)
Basophils Absolute: 0 10*3/uL (ref 0.0–0.1)
Basophils Relative: 0 %
Eosinophils Absolute: 0 10*3/uL (ref 0.0–0.5)
Eosinophils Relative: 0 %
HCT: 39.6 % (ref 39.0–52.0)
Hemoglobin: 13.1 g/dL (ref 13.0–17.0)
Immature Granulocytes: 1 %
Lymphocytes Relative: 7 %
Lymphs Abs: 0.8 10*3/uL (ref 0.7–4.0)
MCH: 28.9 pg (ref 26.0–34.0)
MCHC: 33.1 g/dL (ref 30.0–36.0)
MCV: 87.4 fL (ref 80.0–100.0)
Monocytes Absolute: 0.2 10*3/uL (ref 0.1–1.0)
Monocytes Relative: 2 %
Neutro Abs: 10.4 10*3/uL — ABNORMAL HIGH (ref 1.7–7.7)
Neutrophils Relative %: 90 %
Platelets: 301 10*3/uL (ref 150–400)
RBC: 4.53 MIL/uL (ref 4.22–5.81)
RDW: 13.8 % (ref 11.5–15.5)
WBC: 11.4 10*3/uL — ABNORMAL HIGH (ref 4.0–10.5)
nRBC: 0 % (ref 0.0–0.2)

## 2021-12-18 LAB — COMPREHENSIVE METABOLIC PANEL
ALT: 92 U/L — ABNORMAL HIGH (ref 0–44)
AST: 43 U/L — ABNORMAL HIGH (ref 15–41)
Albumin: 2.7 g/dL — ABNORMAL LOW (ref 3.5–5.0)
Alkaline Phosphatase: 85 U/L (ref 38–126)
Anion gap: 10 (ref 5–15)
BUN: 32 mg/dL — ABNORMAL HIGH (ref 8–23)
CO2: 31 mmol/L (ref 22–32)
Calcium: 8.5 mg/dL — ABNORMAL LOW (ref 8.9–10.3)
Chloride: 95 mmol/L — ABNORMAL LOW (ref 98–111)
Creatinine, Ser: 0.79 mg/dL (ref 0.61–1.24)
GFR, Estimated: >60 mL/min (ref >60)
Glucose, Bld: 240 mg/dL — ABNORMAL HIGH (ref 70–99)
Potassium: 4.3 mmol/L (ref 3.5–5.1)
Sodium: 136 mmol/L (ref 135–145)
Total Bilirubin: 0.7 mg/dL (ref 0.3–1.2)
Total Protein: 6.7 g/dL (ref 6.5–8.1)

## 2021-12-18 LAB — GLUCOSE, CAPILLARY
Glucose-Capillary: 255 mg/dL — ABNORMAL HIGH (ref 70–99)
Glucose-Capillary: 251 mg/dL — ABNORMAL HIGH (ref 70–99)
Glucose-Capillary: 177 mg/dL — ABNORMAL HIGH (ref 70–99)
Glucose-Capillary: 260 mg/dL — ABNORMAL HIGH (ref 70–99)

## 2021-12-18 LAB — FERRITIN: Ferritin: 274 ng/mL (ref 24–336)

## 2021-12-18 LAB — ANCA TITERS
Atypical P-ANCA titer: <1:20 {titer}
C-ANCA: <1:20 {titer}
P-ANCA: <1:20 {titer}

## 2021-12-18 LAB — D-DIMER, QUANTITATIVE: D-Dimer, Quant: 0.83 ug/mL-FEU — ABNORMAL HIGH (ref 0.00–0.50)

## 2021-12-18 LAB — MAGNESIUM: Magnesium: 2.2 mg/dL (ref 1.7–2.4)

## 2021-12-18 LAB — C-REACTIVE PROTEIN: CRP: 8.7 mg/dL — ABNORMAL HIGH (ref <1.0)

## 2021-12-18 LAB — RHEUMATOID FACTOR: Rheumatoid fact SerPl-aCnc: 34.1 IU/mL — ABNORMAL HIGH (ref <14.0)

## 2021-12-18 MED ORDER — INSULIN GLARGINE-YFGN 100 UNIT/ML ~~LOC~~ SOLN
7.0000 [IU] | Freq: Every day | SUBCUTANEOUS | Status: DC
  Administered 2021-12-19 – 2021-12-20 (×2): 7 [IU] via SUBCUTANEOUS
  Filled 2021-12-18 (×3): qty 0.07

## 2021-12-18 MED ORDER — INSULIN ASPART 100 UNIT/ML IJ SOLN
2.0000 [IU] | Freq: Three times a day (TID) | INTRAMUSCULAR | Status: DC
  Administered 2021-12-18 – 2021-12-20 (×6): 2 [IU] via SUBCUTANEOUS

## 2021-12-18 NOTE — Discharge Instructions (Signed)
[facility] Stay Proper nutrition can help your body recover from illness and injury.   Foods and beverages high in protein, vitamins, and minerals help rebuild muscle loss, promote healing, & reduce fall risk.   In addition to eating healthy foods, a nutrition shake is an easy, delicious way to get the nutrition you need during and after your hospital stay  It is recommended that you continue to drink 2 bottles per day of:    Ensure PLUS for at least 1 month (30 days) after your hospital stay   Tips for adding a nutrition shake into your routine: As allowed, drink one with vitamins or medications instead of water or juice Enjoy one as a tasty mid-morning or afternoon snack Drink cold or make a milkshake out of it Drink one instead of milk with cereal or snacks Use as a coffee creamer   Available at the following grocery stores and pharmacies:           * [facility] [phone]            For COUPONS visit: www.ensure.com/join or http://dawson-may.com/   Suggested Substitutions Ensure Plus = Boost Plus = Carnation Breakfast Essentials = Boost Compact Ensure Active Clear = Boost Breeze Glucerna Shake = Boost Glucose Control = Carnation Breakfast Essentials SUGAR FREE

## 2021-12-18 NOTE — Progress Notes (Signed)
SATURATION QUALIFICATIONS: (This note is used to comply with regulatory documentation for home oxygen)  Patient Saturations on Room Air at Rest = 94%  Patient Saturations on Room Air while Ambulating = 83%  Patient Saturations on 3 Liters of oxygen while Ambulating = 88%

## 2021-12-18 NOTE — TOC Progression Note (Signed)
Transition of Care [facility]) - Progression Note    Patient Details  Name: Nathan Turner MRN: [id number] Date of Birth: 1946/02/13  Transition of Care [facility]) CM/SW Contact  Salome Arnt, [place] Phone Number: 12/18/2021, 12:38 PM  Clinical Narrative: Pt will require home O2. Pt requests [employer]. Per MD, pt not d/c today. LCSW notified [employer] but aware pt is not medically stable. TOC will follow up tomorrow.         Barriers to Discharge: Continued Medical Work up  Expected Discharge Plan and Services                                     HH Arranged: RN, PT [facility] Agency: [place] ([facility]) Date [place]: 12/18/21 Time [place]: [phone] Representative spoke with at [place]: [facility] ([place]) Interventions    Readmission Risk Interventions     No data to display

## 2021-12-18 NOTE — TOC Progression Note (Addendum)
Transition of Care [facility]) - Progression Note    Patient Details  Name: SHEENA SIMONIS MRN: [id number] Date of Birth: [birth year]  Transition of Care [facility]) CM/SW Contact  Salome Arnt, [place] Phone Number: 12/18/2021, 9:20 AM  Clinical Narrative:  LCSW discussed recommendation for HHPT/OT with pt who is agreeable. No preference on agency. Referred and accepted by [facility] with [facility]. Will add RN as well. TOC will continue to follow. Pt may require home O2. PT also recommended rolling walker, but pt states he has one at home.           Expected Discharge Plan and Services                                     HH Arranged: RN, PT [facility] Agency: [place] [facility]) Date [place]: 12/18/21 Time [place]: [phone] Representative spoke with at [place]: [place] Determinants of Health ([place]) Interventions    Readmission Risk Interventions     No data to display

## 2021-12-18 NOTE — Progress Notes (Signed)
Initial Nutrition Assessment  DOCUMENTATION CODES:     INTERVENTION:  Glucerna Shake po TID, each supplement provides 220 kcal and 10 grams of protein   When patient blood glucose improves recommend resume Ensure Plus BID for at least 1 month after discharge.   Multivitamin daily  NUTRITION DIAGNOSIS:   Inadequate oral intake related to acute illness (covid) as evidenced by per patient/family report.   GOAL:  Patient will meet greater than or equal to 90% of their needs   MONITOR:  PO intake, Supplement acceptance, Labs, Weight trends  REASON FOR ASSESSMENT:   Malnutrition Screening Tool    ASSESSMENT: patient is a 76 yo male with hx of DM2, HTN, Tobacco use who presents with altered mental status, COVID positive and right lower lobe mass.   Patient is sitting up in bed and feeling better today. His wife passed away 11/22/2022 th. Patient says she had been very ill this year and he was primary caregiver. He lives alone and daughter in law has been provided meals. Patient has been drinking 1-2 Ensure daily. Discussed with him importance of continuing intake for at least 1 month following discharge.   Patient has family support who live near by.   Patient weight history reviewed. Reports weight loss during period caring for his wife. At risk for malnutrition.   Medications: augmentin, insulin, solumedrol, nicoderm, Zinc and vitamin C.   CBG (last 3)  Recent Labs    12/17/21 2023 12/18/21 0740 12/18/21 1111  GLUCAP 205* 255* 251*        Latest Ref Rng & Units 12/18/2021    4:48 AM 12/17/2021    4:16 AM 12/16/2021    5:57 AM  BMP  Glucose 70 - 99 mg/dL 240  205  231   BUN 8 - 23 mg/dL 32  30  24   Creatinine 0.61 - 1.24 mg/dL 0.79  0.85  0.76   Sodium 135 - 145 mmol/L 136  138  137   Potassium 3.5 - 5.1 mmol/L 4.3  3.6  4.0   Chloride 98 - 111 mmol/L 95  95  99   CO2 22 - 32 mmol/L 31  31  29    Calcium 8.9 - 10.3 mg/dL 8.5  8.4  8.2      NUTRITION - FOCUSED  PHYSICAL EXAM:  Flowsheet Row Most Recent Value  Orbital Region Mild depletion  Upper Arm Region Mild depletion  Thoracic and Lumbar Region No depletion  Buccal Region No depletion  Temple Region Mild depletion  Clavicle Bone Region No depletion  Clavicle and Acromion Bone Region No depletion  Scapular Bone Region No depletion  Dorsal Hand Mild depletion  Patellar Region No depletion  Anterior Thigh Region Mild depletion  Posterior Calf Region No depletion  Edema (RD Assessment) None  Hair Reviewed  Eyes Reviewed  Mouth Reviewed  Skin Reviewed  Nails Reviewed       Diet Order:   Diet Order             Diet heart healthy/carb modified Room service appropriate? Yes; Fluid consistency: Thin  Diet effective now                   EDUCATION NEEDS:  Education needs have been addressed  Skin:  Skin Assessment: Reviewed RN Assessment  Last BM:  8/26  Height:   Ht Readings from Last 1 Encounters:  12/14/21 5\' 3"  (1.6 m)    Weight:   Wt Readings from Last 1  Encounters:  12/18/21 62.4 kg    Ideal Body Weight:   56 kg  BMI:  Body mass index is 24.37 kg/m.  Estimated Nutritional Needs:   Kcal:  1900-2200  Protein:  86-95 gr  Fluid:  2 liters daily  Colman Cater MS,RD,CSG,LDN Contact: Shea Evans

## 2021-12-18 NOTE — Inpatient Diabetes Management (Signed)
Inpatient Diabetes Program Recommendations  AACE/ADA: New Consensus Statement on Inpatient Glycemic Control   Target Ranges:  Prepandial:   less than 140 mg/dL      Peak postprandial:   less than 180 mg/dL (1-2 hours)      Critically ill patients:  140 - 180 mg/dL    Latest Reference Range & Units 12/17/21 08:20 12/17/21 11:13 12/17/21 17:02 12/17/21 20:23  Glucose-Capillary 70 - 99 mg/dL 208 (H) 123 (H) 288 (H) 205 (H)   Review of Glycemic Control  Diabetes history: DM2 Outpatient Diabetes medications: None Current orders for Inpatient glycemic control: Semglee 5 units daily, Novolog 0-9 units TID with meals; Solumedrol 40 mg Q12H   Inpatient Diabetes Program Recommendations:    Insulin: If steroids are continued, please consider increasing Semglee to 7 units daily and if patient is eating well, consider ordering Novolog 2 units TID with meals for meal coverage if patient eats at least 50% of meals.  Thanks, Barnie Alderman, RN, MSN, [place] Diabetes Coordinator Inpatient Diabetes Program [phone] (Team Pager from 8am to [place])

## 2021-12-18 NOTE — Progress Notes (Signed)
PROGRESS NOTE    Nathan Turner  NFA:[id number] DOB: 08-24-1945 DOA: 12/14/2021 PCP: Glenda Chroman, MD   Brief Narrative:    Nathan Turner is a 76 y.o. male with medical history significant of T2DM, essential hypertension, hyperlipidemia, insomnia, tobacco abuse who presents to the emergency department with complaint of altered mental status.  He has also had a fall at home and was admitted with acute encephalopathy in the setting of hypoxemic respiratory failure.  He is noted to have a new right lower lobe mass on CT chest.  There is no finding of PE and he does have some interstitial edema and what appears to be a flareup of COPD along with some acute CHF.  He was seen by pulmonology with concern for tracheobronchitis on 8/28 and was started on Augmentin.  He was also then diagnosed with COVID and has been started on treatment.  He continues to deteriorate with small amounts of activity and has continued high oxygen requirements.  Assessment & Plan:   Principal Problem:   Acute respiratory failure with hypoxia (HCC) Active Problems:   Altered mental status   Interstitial edema   Leukocytosis   Hypoalbuminemia due to protein-calorie malnutrition (HCC)   Tobacco abuse   Elevated d-dimer   Fall at home, initial encounter   Uncontrolled type 2 diabetes mellitus with hyperglycemia (HCC)   Essential hypertension   Mixed hyperlipidemia   Descending aortic aneurysm (HCC)   Multiple pulmonary nodules determined by computed tomography of lung   Acute hypercapnic respiratory failure (HCC)   COPD with acute exacerbation (HCC)  Assessment and Plan:   Acute respiratory failure with hypoxia-multifactorial with COVID-19 infection Patient was requiring supplemental oxygen to maintain normal O2 sats.  Shortness of breath started after sustaining a fall at home 2 days ago Appears to be related to COVID-19 infection with possible superimposed bacterial pneumonia Lasix already given, patient  appears euvolemic, discontinue further Lasix Continue Solu-Medrol and albuterol inhaler Contact precautions and started on Paxlovid 8/28 Augmentin as recommended by pulmonology Continue incentive telemetry and flutter valve Continue supplemental oxygen to maintain O2 sats > 94% with plan to wean patient off supplemental oxygen as tolerated.  Continues to have high oxygen requirement and was able to ambulate earlier, but started to decompensate and is now on 6 L.   Acute encephalopathy secondary to above-resolved Patient has periods of agitation and confusion for which Haldol ordered prn CT head NAD Continue to monitor patient   Acute mild interstitial edema Chest x-ray showed mild interstitial edema IV Lasix 40 mg twice daily now discontinued 2D echocardiogram with LVEF 50-55% and grade 1 diastolic dysfunction   Right lower lobe mass CT angiography of chest showed spiculated mixed solid and cystic mass within the right lower lobe suspicious for a primary bronchogenic malignancy. Associated mediastinal and hilar adenopathy is suspicious for nodal metastatic disease. Appreciate pulmonology evaluation with labs ordered with noted multiple lung nodules and cavitary lesions, started on Augmentin 8/28 for total 10 days Will need outpatient follow-up with possible bronchoscopy/PET scan   Elevated D-dimer D-dimer 1.34, CT angiography of chest showed no pulmonary embolism   Fall at home Continue fall precaution and neuro precaution PT recommendations for home health and walker   Thoracic descending aneurysm CT angiography of chest showed aneurysmal dilation of the descending thoracic aorta measuring up to 3.9 cm in diameter.  Annual imaging follow-up by CT or MRI recommended by radiologist   Leukocytosis possibly reactive-downtrending WBC 12.4, continue to monitor WBC  with morning labs   Hypoalbuminemia possibly secondary to mild protein calorie malnutrition Albumin 3.4, protein  supplement to be given   T2DM with hyperglycemia Adjusted insulin per diabetes coordinator recommendations Hemoglobin A1c 6.4% Continue ISS and hypoglycemia protocol   Essential hypertension Continue diltiazem   Mixed hyperlipidemia Continue lovastatin and Lovaza   Insomnia Continue Restoril per home regimen   Tobacco abuse Patient was counseled on tobacco abuse cessation     DVT prophylaxis: Lovenox Code Status: Full Family Communication: Discussed with son on phone 8/29 Disposition Plan:  Status is: Inpatient Remains inpatient appropriate because: Need for IV medications   Consultants:  Pulmonology   Procedures:  See below   Antimicrobials:  Anti-infectives (From admission, onward)    Start     Dose/Rate Route Frequency Ordered Stop   12/17/21 1445  nirmatrelvir/ritonavir EUA (PAXLOVID) 3 tablet        3 tablet Oral 2 times daily 12/17/21 1347 12/22/21 0959   12/17/21 1430  amoxicillin-clavulanate (AUGMENTIN) 875-125 MG per tablet 1 tablet        1 tablet Oral Every 12 hours 12/17/21 1330         Subjective: Patient seen and evaluated today with no new acute complaints or concerns. No acute concerns or events noted overnight.  He is eager to go home, but continues to have significant coughing and shortness of breath especially with ambulation.  Objective: Vitals:   12/17/21 2044 12/18/21 0430 12/18/21 0528 12/18/21 0713  BP:  139/77    Pulse:  (!) 54    Resp:  20    Temp:  (!) 97.3 F (36.3 C)    TempSrc:      SpO2: 93% 92%  93%  Weight:   62.4 kg   Height:        Intake/Output Summary (Last 24 hours) at 12/18/2021 1355 Last data filed at 12/18/2021 3474 Gross per 24 hour  Intake 240 ml  Output 1550 ml  Net -1310 ml   Filed Weights   12/14/21 1854 12/17/21 0500 12/18/21 0528  Weight: 65.3 kg 61.5 kg 62.4 kg    Examination:  General exam: Appears calm and comfortable  Respiratory system: Clear to auscultation. Respiratory effort normal.   Currently on 6 L nasal cannula. Cardiovascular system: S1 & S2 heard, RRR.  Gastrointestinal system: Abdomen is soft Central nervous system: Alert and awake Extremities: No edema Skin: No significant lesions noted Psychiatry: Flat affect.    Data Reviewed: I have personally reviewed following labs and imaging studies  CBC: Recent Labs  Lab 12/14/21 1850 12/15/21 0602 12/16/21 0557 12/17/21 0416 12/18/21 0448  WBC 12.4* 11.2* 13.1* 13.7* 11.4*  NEUTROABS  --   --   --   --  10.4*  HGB 12.1* 12.2* 12.2* 12.7* 13.1  HCT 36.9* 36.2* 36.3* 38.1* 39.6  MCV 88.9 87.7 87.3 87.4 87.4  PLT 244 249 260 297 259   Basic Metabolic Panel: Recent Labs  Lab 12/14/21 1850 12/15/21 0602 12/16/21 0557 12/17/21 0416 12/18/21 0448  NA 139 140 137 138 136  K 4.5 3.9 4.0 3.6 4.3  CL 101 101 99 95* 95*  CO2 27 32 29 31 31   GLUCOSE 188* 152* 231* 205* 240*  BUN 43* 32* 24* 30* 32*  CREATININE 1.20 0.89 0.76 0.85 0.79  CALCIUM 8.8* 8.6* 8.2* 8.4* 8.5*  MG  --   --  2.2 2.1 2.2   GFR: Estimated Creatinine Clearance: 61.2 mL/min (by C-G formula based on SCr of 0.79  mg/dL). Liver Function Tests: Recent Labs  Lab 12/14/21 1850 12/15/21 0602 12/18/21 0448  AST 30 36 43*  ALT 50* 50* 92*  ALKPHOS 87 86 85  BILITOT 1.0 0.9 0.7  PROT 7.3 6.5 6.7  ALBUMIN 3.4* 2.9* 2.7*   No results for input(s): "LIPASE", "AMYLASE" in the last 168 hours. No results for input(s): "AMMONIA" in the last 168 hours. Coagulation Profile: No results for input(s): "INR", "PROTIME" in the last 168 hours. Cardiac Enzymes: No results for input(s): "CKTOTAL", "CKMB", "CKMBINDEX", "TROPONINI" in the last 168 hours. BNP (last 3 results) No results for input(s): "PROBNP" in the last 8760 hours. HbA1C: No results for input(s): "HGBA1C" in the last 72 hours. CBG: Recent Labs  Lab 12/17/21 1113 12/17/21 1702 12/17/21 2023 12/18/21 0740 12/18/21 1111  GLUCAP 123* 288* 205* 255* 251*   Lipid Profile: No  results for input(s): "CHOL", "HDL", "LDLCALC", "TRIG", "CHOLHDL", "LDLDIRECT" in the last 72 hours. Thyroid Function Tests: No results for input(s): "TSH", "T4TOTAL", "FREET4", "T3FREE", "THYROIDAB" in the last 72 hours. Anemia Panel: Recent Labs    12/18/21 0448  FERRITIN 274   Sepsis Labs: No results for input(s): "PROCALCITON", "LATICACIDVEN" in the last 168 hours.  Recent Results (from the past 240 hour(s))  SARS Coronavirus 2 by RT PCR (hospital order, performed in [facility] hospital lab) *cepheid single result test* Anterior Nasal Swab     Status: Abnormal   Collection Time: 12/17/21 12:55 PM   Specimen: Anterior Nasal Swab  Result Value Ref Range Status   SARS Coronavirus 2 by RT PCR POSITIVE (A) NEGATIVE Final    Comment: (NOTE) SARS-CoV-2 target nucleic acids are DETECTED  SARS-CoV-2 RNA is generally detectable in upper respiratory specimens  during the acute phase of infection.  Positive results are indicative  of the presence of the identified virus, but do not rule out bacterial infection or co-infection with other pathogens not detected by the test.  Clinical correlation with patient history and  other diagnostic information is necessary to determine patient infection status.  The expected result is negative.  Fact Sheet for Patients:   https://www.patel.info/   Fact Sheet for Healthcare Providers:   https://hall.com/    This test is not yet approved or cleared by the [place] FDA and  has been authorized for detection and/or diagnosis of SARS-CoV-2 by FDA under an Emergency Use Authorization (EUA).  This EUA will remain in effect (meaning this test can be used) for the duration of  the COVID-19 declaration under Section 564(b)(1)  of the Act, 21 U.S.C. section 360-bbb-3(b)(1), unless the authorization is terminated or revoked sooner.   Performed at [facility], [address], [place], [place] [address]           Radiology Studies: No results found.      Scheduled Meds:  albuterol  2 puff Inhalation BID   amoxicillin-clavulanate  1 tablet Oral Q12H   vitamin C  500 mg Oral Daily   aspirin EC  81 mg Oral Q48H   citalopram  20 mg Oral Daily   diltiazem  120 mg Oral Daily   enoxaparin (LOVENOX) injection  40 mg Subcutaneous Q24H   feeding supplement (GLUCERNA SHAKE)  237 mL Oral TID BM   insulin aspart  0-9 Units Subcutaneous TID WC   insulin aspart  2 Units Subcutaneous TID WC   [START ON 12/19/2021] insulin glargine-yfgn  7 Units Subcutaneous Daily   methylPREDNISolone (SOLU-MEDROL) injection  40 mg Intravenous Q12H   nicotine  21 mg Transdermal Daily   nirmatrelvir/ritonavir EUA  3 tablet Oral BID   pravastatin  20 mg Oral q1800   rOPINIRole  0.5 mg Oral QHS   temazepam  15 mg Oral QHS   zinc sulfate  220 mg Oral Daily     LOS: 3 days    Time spent: 35 minutes    Jabri Blancett Darleen Crocker, DO [facility]  If 7PM-7AM, please contact night-coverage [email or web address] 12/18/2021, 1:55 PM

## 2021-12-19 LAB — D-DIMER, QUANTITATIVE: D-Dimer, Quant: 0.71 ug/mL-FEU — ABNORMAL HIGH (ref 0.00–0.50)

## 2021-12-19 LAB — COMPREHENSIVE METABOLIC PANEL
ALT: 74 U/L — ABNORMAL HIGH (ref 0–44)
AST: 23 U/L (ref 15–41)
Albumin: 2.7 g/dL — ABNORMAL LOW (ref 3.5–5.0)
Alkaline Phosphatase: 79 U/L (ref 38–126)
Anion gap: 6 (ref 5–15)
BUN: 36 mg/dL — ABNORMAL HIGH (ref 8–23)
CO2: 34 mmol/L — ABNORMAL HIGH (ref 22–32)
Calcium: 8.6 mg/dL — ABNORMAL LOW (ref 8.9–10.3)
Chloride: 95 mmol/L — ABNORMAL LOW (ref 98–111)
Creatinine, Ser: 0.8 mg/dL (ref 0.61–1.24)
GFR, Estimated: >60 mL/min (ref >60)
Glucose, Bld: 263 mg/dL — ABNORMAL HIGH (ref 70–99)
Potassium: 4.8 mmol/L (ref 3.5–5.1)
Sodium: 135 mmol/L (ref 135–145)
Total Bilirubin: 0.5 mg/dL (ref 0.3–1.2)
Total Protein: 6.4 g/dL — ABNORMAL LOW (ref 6.5–8.1)

## 2021-12-19 LAB — GLUCOSE, CAPILLARY
Glucose-Capillary: 233 mg/dL — ABNORMAL HIGH (ref 70–99)
Glucose-Capillary: 251 mg/dL — ABNORMAL HIGH (ref 70–99)
Glucose-Capillary: 142 mg/dL — ABNORMAL HIGH (ref 70–99)
Glucose-Capillary: 208 mg/dL — ABNORMAL HIGH (ref 70–99)
Glucose-Capillary: 246 mg/dL — ABNORMAL HIGH (ref 70–99)

## 2021-12-19 LAB — CBC WITH DIFFERENTIAL/PLATELET
Abs Immature Granulocytes: 0.1 10*3/uL — ABNORMAL HIGH (ref 0.00–0.07)
Basophils Absolute: 0 10*3/uL (ref 0.0–0.1)
Basophils Relative: 0 %
Eosinophils Absolute: 0 10*3/uL (ref 0.0–0.5)
Eosinophils Relative: 0 %
HCT: 40.5 % (ref 39.0–52.0)
Hemoglobin: 13.5 g/dL (ref 13.0–17.0)
Immature Granulocytes: 1 %
Lymphocytes Relative: 6 %
Lymphs Abs: 0.8 10*3/uL (ref 0.7–4.0)
MCH: 29.2 pg (ref 26.0–34.0)
MCHC: 33.3 g/dL (ref 30.0–36.0)
MCV: 87.7 fL (ref 80.0–100.0)
Monocytes Absolute: 0.3 10*3/uL (ref 0.1–1.0)
Monocytes Relative: 2 %
Neutro Abs: 12.4 10*3/uL — ABNORMAL HIGH (ref 1.7–7.7)
Neutrophils Relative %: 91 %
Platelets: 324 10*3/uL (ref 150–400)
RBC: 4.62 MIL/uL (ref 4.22–5.81)
RDW: 13.7 % (ref 11.5–15.5)
WBC: 13.6 10*3/uL — ABNORMAL HIGH (ref 4.0–10.5)
nRBC: 0 % (ref 0.0–0.2)

## 2021-12-19 LAB — FERRITIN: Ferritin: 227 ng/mL (ref 24–336)

## 2021-12-19 LAB — CEA: CEA: 14.2 ng/mL — ABNORMAL HIGH (ref 0.0–4.7)

## 2021-12-19 LAB — C-REACTIVE PROTEIN: CRP: 4.8 mg/dL — ABNORMAL HIGH (ref <1.0)

## 2021-12-19 LAB — HISTOPLASMA ANTIGEN, URINE: Histoplasma Antigen, urine: <0.5 (ref <0.5)

## 2021-12-19 LAB — MAGNESIUM: Magnesium: 2.3 mg/dL (ref 1.7–2.4)

## 2021-12-19 MED ORDER — PREDNISONE 20 MG PO TABS
40.0000 mg | ORAL_TABLET | Freq: Every day | ORAL | Status: DC
  Administered 2021-12-20: 40 mg via ORAL
  Filled 2021-12-19: qty 2

## 2021-12-19 MED ORDER — PANTOPRAZOLE SODIUM 40 MG PO TBEC
40.0000 mg | DELAYED_RELEASE_TABLET | Freq: Every day | ORAL | Status: DC
  Administered 2021-12-19 – 2021-12-20 (×2): 40 mg via ORAL
  Filled 2021-12-19 (×2): qty 1

## 2021-12-19 MED ORDER — PRAVASTATIN SODIUM 10 MG PO TABS: 20.0000 mg | ORAL_TABLET | Freq: Every day | ORAL | Status: DC

## 2021-12-19 NOTE — Progress Notes (Signed)
Gave patient flutter valve and explained usage to patient.  Left flutter with patient at bedside.

## 2021-12-19 NOTE — Inpatient Diabetes Management (Signed)
Inpatient Diabetes Program Recommendations  AACE/ADA: New Consensus Statement on Inpatient Glycemic Control  Target Ranges:  Prepandial:   less than 140 mg/dL      Peak postprandial:   less than 180 mg/dL (1-2 hours)      Critically ill patients:  140 - 180 mg/dL    Latest Reference Range & Units 12/19/21 02:33  Glucose 70 - 99 mg/dL 263 (H)    Latest Reference Range & Units 12/18/21 07:40 12/18/21 11:11 12/18/21 17:48 12/18/21 21:28  Glucose-Capillary 70 - 99 mg/dL 255 (H) 251 (H) 177 (H) 260 (H)    Review of Glycemic Control  Diabetes history: DM2 Outpatient Diabetes medications: None Current orders for Inpatient glycemic control: Semglee 7 units daily, Novolog 0-9 units TID with meals, Novolog 2 units TID with meals; Solumedrol 40 mg Q12H   Inpatient Diabetes Program Recommendations:     Insulin: Patient received Semglee 5 units on 8/29 and will receive 7 units today.  If steroids are continued as ordered, please consider increasing  meal coverage to Novolog 4 units TID with meals.  Thanks, Barnie Alderman, RN, MSN, [place] Diabetes Coordinator Inpatient Diabetes Program [phone] (Team Pager from 8am to [place])

## 2021-12-19 NOTE — Progress Notes (Signed)
PROGRESS NOTE    Nathan Turner  MWU:[id number] DOB: 24-Jun-1945 DOA: 12/14/2021 PCP: Glenda Chroman, MD   Brief Narrative:   Nathan Turner is a 76 y.o. male with medical history significant of T2DM, essential hypertension, hyperlipidemia, insomnia, tobacco abuse who presents to the emergency department with complaint of altered mental status.  He has also had a fall at home and was admitted with acute encephalopathy in the setting of hypoxemic respiratory failure.  He is noted to have a new right lower lobe mass on CT chest.  There is no finding of PE and he does have some interstitial edema and what appears to be a flareup of COPD along with some acute CHF.  He was seen by pulmonology with concern for tracheobronchitis on 8/28 and was started on Augmentin.  He was also then diagnosed with COVID and has been started on treatment.  He continues to deteriorate with small amounts of activity and has continued high oxygen requirements.  Assessment & Plan:   Principal Problem:   Acute respiratory failure with hypoxia (HCC) Active Problems:   Altered mental status   Interstitial edema   Leukocytosis   Hypoalbuminemia due to protein-calorie malnutrition (HCC)   Tobacco abuse   Elevated d-dimer   Fall at home, initial encounter   Uncontrolled type 2 diabetes mellitus with hyperglycemia (HCC)   Essential hypertension   Mixed hyperlipidemia   Descending aortic aneurysm (HCC)   Multiple pulmonary nodules determined by computed tomography of lung   Acute hypercapnic respiratory failure (HCC)   COPD with acute exacerbation (HCC)  Assessment and Plan: Acute respiratory failure with hypoxia-multifactorial with COVID-19 infection -Patient was requiring supplemental oxygen to maintain normal O2 sats.  Shortness of breath started after sustaining a fall at home 2 days ago -Appears to be related to COVID-19 infection with possible superimposed bacterial pneumonia -Lasix already given, patient  appears euvolemic, discontinue further Lasix -Continue steroids (transitioning to oral route) and albuterol inhaler -Contact precautions and started on Paxlovid 8/28; 1 more day of therapy pending at this point. -Augmentin as recommended by pulmonology -Continue incentive telemetry and flutter valve -Continue supplemental oxygen to maintain O2 sats > 94% with plan to wean  -Patient will need oxygen supplementation at time of discharge.  Acute encephalopathy secondary to above-resolved -Patient has periods of agitation and confusion for which Haldol ordered prn -CT head not demonstrating acute intracranial normalities. -Continue to monitor patient -Continue current supplementation.   Acute mild interstitial edema -Chest x-ray showed mild interstitial edema -2D echocardiogram with LVEF 50-55% and grade 1 diastolic dysfunction -Healthy diet discussed with patient -No diuretics to be continued currently -No crackles appreciated on exam. -No lower extremity edema seen.   Right lower lobe mass -CT angiography of chest showed spiculated mixed solid and cystic mass within the right lower lobe suspicious for a primary bronchogenic malignancy. Associated mediastinal and hilar adenopathy is suspicious for nodal metastatic disease. -Appreciate pulmonology evaluation with labs ordered with noted multiple lung nodules and cavitary lesions, started on Augmentin 8/28 for total 10 days -Will need outpatient follow-up with possible bronchoscopy/PET scan   Elevated D-dimer -D-dimer 1.34, CT angiography of chest showed no pulmonary embolism -Continue DVT prophylaxis while inpatient.   Fall at home -Continue fall precaution and neuro precaution -PT recommendations for home health and walker -Home health orders in place for PT at discharge.   Thoracic descending aneurysm -CT angiography of chest showed aneurysmal dilation of the descending thoracic aorta measuring up to 3.9 cm  in diameter.  -Annual  imaging follow-up by CT or MRI recommended by radiologist   Leukocytosis possibly reactive-downtrending -Steroids usage causing confounding factors -Continue to follow WBCs trend -Currently Szymon treatment with antibiotics as recommended by pulmonology service.   Hypoalbuminemia possibly secondary to mild protein calorie malnutrition -Albumin 3.4, protein supplement to be given   T2DM with hyperglycemia -Adjusted insulin per diabetes coordinator recommendations given elevated CBGs while receiving the steroids management. -Hemoglobin A1c 6.4%; patient following diet control as an outpatient. -Continue ISS and hypoglycemia protocol   Essential hypertension -Continue diltiazem -Heart healthy diet discussed with patient -Stable vital signs appreciated.   Mixed hyperlipidemia -Continue lovastatin and Lovaza   Insomnia -Continue Restoril per home regimen   Tobacco abuse -Patient was counseled on tobacco abuse cessation     DVT prophylaxis: Lovenox Code Status: Full Family Communication: Discussed with son at bedside (12/19/2021). Disposition Plan:  Status is: Inpatient Remains inpatient appropriate because: Need for IV medications   Consultants:  Pulmonology   Procedures:  See below   Antimicrobials:  Anti-infectives (From admission, onward)    Start     Dose/Rate Route Frequency Ordered Stop   12/17/21 1445  nirmatrelvir/ritonavir EUA (PAXLOVID) 3 tablet        3 tablet Oral 2 times daily 12/17/21 1347 12/22/21 0959   12/17/21 1430  amoxicillin-clavulanate (AUGMENTIN) 875-125 MG per tablet 1 tablet        1 tablet Oral Every 12 hours 12/17/21 1330         Subjective: Complaining of intermittent coughing spells (productive) and short winded sensation with activity; afebrile and expressing no chest pain.  Requiring 3 L nasal cannula supplementation at rest and up to 6 L with activity.  Elevated CBGs while using steroids appreciated.  Objective: Vitals:   12/18/21  2113 12/19/21 0348 12/19/21 0509 12/19/21 0724  BP: 129/66  (!) 144/74   Pulse: 60  (!) 54   Resp: 16  20   Temp: (!) 97.1 F (36.2 C)  98 F (36.7 C)   TempSrc:      SpO2: 93%  92% 93%  Weight:  61.9 kg    Height:        Intake/Output Summary (Last 24 hours) at 12/19/2021 1911 Last data filed at 12/19/2021 1700 Gross per 24 hour  Intake 480 ml  Output 1525 ml  Net -1045 ml   Filed Weights   12/17/21 0500 12/18/21 0528 12/19/21 0348  Weight: 61.5 kg 62.4 kg 61.9 kg    Examination: General exam: Alert, awake, oriented x 3; complaining of intermittent coughing spells unsure when the sensation with activity. Respiratory system: Positive rhonchi bilaterally (right more than left); tachypnea with activity appreciated.  No use of accessory muscles.  3 L nasal cannula supplementation at rest and requiring up to 6 L with exertion. Cardiovascular system:RRR.  No rubs, no gallops, no JVD. Gastrointestinal system: Abdomen is nondistended, soft and nontender. No organomegaly or masses felt. Normal bowel sounds heard. Central nervous system: Alert and oriented. No focal neurological deficits. Extremities: No cyanosis or clubbing. Skin: No petechiae. Psychiatry: Judgement and insight appear normal. Mood & affect appropriate.    Data Reviewed: I have personally reviewed following labs and imaging studies  CBC: Recent Labs  Lab 12/15/21 0602 12/16/21 0557 12/17/21 0416 12/18/21 0448 12/19/21 0233  WBC 11.2* 13.1* 13.7* 11.4* 13.6*  NEUTROABS  --   --   --  10.4* 12.4*  HGB 12.2* 12.2* 12.7* 13.1 13.5  HCT 36.2* 36.3* 38.1*  39.6 40.5  MCV 87.7 87.3 87.4 87.4 87.7  PLT 249 260 297 301 735   Basic Metabolic Panel: Recent Labs  Lab 12/15/21 0602 12/16/21 0557 12/17/21 0416 12/18/21 0448 12/19/21 0233  NA 140 137 138 136 135  K 3.9 4.0 3.6 4.3 4.8  CL 101 99 95* 95* 95*  CO2 32 29 31 31  34*  GLUCOSE 152* 231* 205* 240* 263*  BUN 32* 24* 30* 32* 36*  CREATININE 0.89 0.76  0.85 0.79 0.80  CALCIUM 8.6* 8.2* 8.4* 8.5* 8.6*  MG  --  2.2 2.1 2.2 2.3   GFR: Estimated Creatinine Clearance: 61.2 mL/min (by C-G formula based on SCr of 0.8 mg/dL).  Liver Function Tests: Recent Labs  Lab 12/14/21 1850 12/15/21 0602 12/18/21 0448 12/19/21 0233  AST 30 36 43* 23  ALT 50* 50* 92* 74*  ALKPHOS 87 86 85 79  BILITOT 1.0 0.9 0.7 0.5  PROT 7.3 6.5 6.7 6.4*  ALBUMIN 3.4* 2.9* 2.7* 2.7*   CBG: Recent Labs  Lab 12/18/21 1748 12/18/21 2128 12/19/21 0750 12/19/21 1112 12/19/21 1616  GLUCAP 177* 260* 233* 251* 142*   Anemia Panel: Recent Labs    12/18/21 0448 12/19/21 0233  FERRITIN 274 227   Sepsis Labs: No results for input(s): "PROCALCITON", "LATICACIDVEN" in the last 168 hours.  Recent Results (from the past 240 hour(s))  SARS Coronavirus 2 by RT PCR (hospital order, performed in [facility] hospital lab) *cepheid single result test* Anterior Nasal Swab     Status: Abnormal   Collection Time: 12/17/21 12:55 PM   Specimen: Anterior Nasal Swab  Result Value Ref Range Status   SARS Coronavirus 2 by RT PCR POSITIVE (A) NEGATIVE Final    Comment: (NOTE) SARS-CoV-2 target nucleic acids are DETECTED  SARS-CoV-2 RNA is generally detectable in upper respiratory specimens  during the acute phase of infection.  Positive results are indicative  of the presence of the identified virus, but do not rule out bacterial infection or co-infection with other pathogens not detected by the test.  Clinical correlation with patient history and  other diagnostic information is necessary to determine patient infection status.  The expected result is negative.  Fact Sheet for Patients:   https://www.patel.info/   Fact Sheet for Healthcare Providers:   https://hall.com/    This test is not yet approved or cleared by the [place] FDA and  has been authorized for detection and/or diagnosis of SARS-CoV-2 by FDA under an  Emergency Use Authorization (EUA).  This EUA will remain in effect (meaning this test can be used) for the duration of  the COVID-19 declaration under Section 564(b)(1)  of the Act, 21 U.S.C. section 360-bbb-3(b)(1), unless the authorization is terminated or revoked sooner.   Performed at [facility], [address] [place], [place] [address]      Radiology Studies: No results found.    Scheduled Meds:  albuterol  2 puff Inhalation BID   amoxicillin-clavulanate  1 tablet Oral Q12H   vitamin C  500 mg Oral Daily   aspirin EC  81 mg Oral Q48H   citalopram  20 mg Oral Daily   diltiazem  120 mg Oral Daily   enoxaparin (LOVENOX) injection  40 mg Subcutaneous Q24H   feeding supplement (GLUCERNA SHAKE)  237 mL Oral TID BM   insulin aspart  0-9 Units Subcutaneous TID WC   insulin aspart  2 Units Subcutaneous TID WC   insulin glargine-yfgn  7 Units Subcutaneous Daily   nicotine  21 mg Transdermal Daily   nirmatrelvir/ritonavir EUA  3 tablet Oral BID   pantoprazole  40 mg Oral Daily   [START ON 12/22/2021] pravastatin  20 mg Oral q1800   [START ON 12/20/2021] predniSONE  40 mg Oral Q breakfast   rOPINIRole  0.5 mg Oral QHS   temazepam  15 mg Oral QHS   zinc sulfate  220 mg Oral Daily     LOS: 4 days    Barton Dubois, MD [facility]  If 7PM-7AM, please contact night-coverage [email or web address] 12/19/2021, 7:11 PM

## 2021-12-20 LAB — COMPREHENSIVE METABOLIC PANEL
ALT: 56 U/L — ABNORMAL HIGH (ref 0–44)
AST: 16 U/L (ref 15–41)
Albumin: 2.6 g/dL — ABNORMAL LOW (ref 3.5–5.0)
Alkaline Phosphatase: 68 U/L (ref 38–126)
Anion gap: 5 (ref 5–15)
BUN: 35 mg/dL — ABNORMAL HIGH (ref 8–23)
CO2: 32 mmol/L (ref 22–32)
Calcium: 8.4 mg/dL — ABNORMAL LOW (ref 8.9–10.3)
Chloride: 100 mmol/L (ref 98–111)
Creatinine, Ser: 0.84 mg/dL (ref 0.61–1.24)
GFR, Estimated: >60 mL/min (ref >60)
Glucose, Bld: 235 mg/dL — ABNORMAL HIGH (ref 70–99)
Potassium: 4.5 mmol/L (ref 3.5–5.1)
Sodium: 137 mmol/L (ref 135–145)
Total Bilirubin: 0.5 mg/dL (ref 0.3–1.2)
Total Protein: 5.9 g/dL — ABNORMAL LOW (ref 6.5–8.1)

## 2021-12-20 LAB — CBC WITH DIFFERENTIAL/PLATELET
Abs Immature Granulocytes: 0.12 10*3/uL — ABNORMAL HIGH (ref 0.00–0.07)
Basophils Absolute: 0 10*3/uL (ref 0.0–0.1)
Basophils Relative: 0 %
Eosinophils Absolute: 0 10*3/uL (ref 0.0–0.5)
Eosinophils Relative: 0 %
HCT: 40 % (ref 39.0–52.0)
Hemoglobin: 12.9 g/dL — ABNORMAL LOW (ref 13.0–17.0)
Immature Granulocytes: 1 %
Lymphocytes Relative: 5 %
Lymphs Abs: 0.7 10*3/uL (ref 0.7–4.0)
MCH: 28.4 pg (ref 26.0–34.0)
MCHC: 32.3 g/dL (ref 30.0–36.0)
MCV: 87.9 fL (ref 80.0–100.0)
Monocytes Absolute: 0.3 10*3/uL (ref 0.1–1.0)
Monocytes Relative: 2 %
Neutro Abs: 12.5 10*3/uL — ABNORMAL HIGH (ref 1.7–7.7)
Neutrophils Relative %: 92 %
Platelets: 319 10*3/uL (ref 150–400)
RBC: 4.55 MIL/uL (ref 4.22–5.81)
RDW: 13.7 % (ref 11.5–15.5)
WBC: 13.7 10*3/uL — ABNORMAL HIGH (ref 4.0–10.5)
nRBC: 0 % (ref 0.0–0.2)

## 2021-12-20 LAB — D-DIMER, QUANTITATIVE: D-Dimer, Quant: 0.78 ug/mL-FEU — ABNORMAL HIGH (ref 0.00–0.50)

## 2021-12-20 LAB — MAGNESIUM: Magnesium: 2.4 mg/dL (ref 1.7–2.4)

## 2021-12-20 LAB — BLASTOMYCES ANTIGEN: Blastomyces Antigen: NOT DETECTED ng/mL

## 2021-12-20 LAB — FERRITIN: Ferritin: 274 ng/mL (ref 24–336)

## 2021-12-20 LAB — ALPHA-1-ANTITRYPSIN PHENOTYP: A-1 Antitrypsin, Ser: 322 mg/dL — ABNORMAL HIGH (ref 101–187)

## 2021-12-20 LAB — C-REACTIVE PROTEIN: CRP: 2.2 mg/dL — ABNORMAL HIGH (ref <1.0)

## 2021-12-20 LAB — GLUCOSE, CAPILLARY
Glucose-Capillary: 220 mg/dL — ABNORMAL HIGH (ref 70–99)
Glucose-Capillary: 306 mg/dL — ABNORMAL HIGH (ref 70–99)

## 2021-12-20 MED ORDER — ASCORBIC ACID 500 MG PO TABS: 500.0000 mg | ORAL_TABLET | Freq: Every day | ORAL | 1 refills | Status: DC

## 2021-12-20 MED ORDER — ZINC SULFATE 220 (50 ZN) MG PO CAPS: 220.0000 mg | ORAL_CAPSULE | Freq: Every day | ORAL | 1 refills | Status: DC

## 2021-12-20 MED ORDER — AMOXICILLIN-POT CLAVULANATE 875-125 MG PO TABS: 1.0000 | ORAL_TABLET | Freq: Two times a day (BID) | ORAL | 0 refills | Status: AC

## 2021-12-20 MED ORDER — PANTOPRAZOLE SODIUM 40 MG PO TBEC: 40.0000 mg | DELAYED_RELEASE_TABLET | Freq: Every day | ORAL | 1 refills | Status: DC

## 2021-12-20 MED ORDER — GUAIFENESIN-DM 100-10 MG/5ML PO SYRP: 5.0000 mL | ORAL_SOLUTION | Freq: Three times a day (TID) | ORAL | 0 refills | Status: DC | PRN

## 2021-12-20 MED ORDER — PREDNISONE 20 MG PO TABS: ORAL_TABLET | ORAL | 0 refills | Status: DC

## 2021-12-20 MED ORDER — ALBUTEROL SULFATE HFA 108 (90 BASE) MCG/ACT IN AERS: 2.0000 | INHALATION_SPRAY | Freq: Four times a day (QID) | RESPIRATORY_TRACT | 1 refills | Status: AC | PRN

## 2021-12-20 MED ORDER — NICOTINE 21 MG/24HR TD PT24: 21.0000 mg | MEDICATED_PATCH | Freq: Every day | TRANSDERMAL | 1 refills | Status: DC

## 2021-12-20 MED ORDER — NIRMATRELVIR/RITONAVIR (PAXLOVID)TABLET: 3.0000 | ORAL_TABLET | Freq: Two times a day (BID) | ORAL | Status: AC

## 2021-12-20 NOTE — Progress Notes (Addendum)
SATURATION QUALIFICATIONS: Patient Saturations on Room Air at Rest = 94%  Patient Saturations on Room Air while Ambulating = 88%  Patient Saturations on 2 Liters of oxygen while Ambulating = 93%  Patient needs oxygen due to desat when ambulating.

## 2021-12-20 NOTE — Progress Notes (Signed)
Patient stable and ready for discharge home. Patient's son here at bedside. Writer removed IV without issues. Writer went over discharge paperwork with patient and son and both verbalized understanding and had no questions. Writer called [employer] for patient prior to discharge for his home O2 setup and placed patient on his home O2 tank for home. Patient's son helped dress patient and packed patient's belongings. Writer transported patient to son's car for discharge.

## 2021-12-20 NOTE — Progress Notes (Signed)
Occupational Therapy Treatment Patient Details Name: Nathan Turner MRN: [id number] DOB: 1945/07/13 Today's Date: 12/20/2021   History of present illness 76 y.o. M admitted on 12/14/21 due to AMS and fall. CT head negative. Pt being worked up for hypoxia.  PMH significant for  T2DM, essential hypertension, hyperlipidemia, insomnia, tobacco abuse.   OT comments  Pt agreeable to OT treatment demonstrates good ability to complete bed mobility and ambulation in room without physical assist and supervision mostly for safety at this time. Pt leaned on walls intermittently during transfer to toilet and was able to stand at the sink to complete grooming tasks for several minutes without sings of SOB. Pt on 3.5 L supplemental O2 throughout session. Pt also demonstrated lower body dressing while seated without any difficulty today. Pt will benefit from continued OT in the hospital and recommended venue below to increase strength, balance, and endurance for safe ADL's.      Recommendations for follow up therapy are one component of a multi-disciplinary discharge planning process, led by the attending physician.  Recommendations may be updated based on patient status, additional functional criteria and insurance authorization.    Follow Up Recommendations  Home health OT    Assistance Recommended at Discharge Set up Supervision/Assistance  Patient can return home with the following  A little help with walking and/or transfers;A little help with bathing/dressing/bathroom;Assistance with cooking/housework;Help with stairs or ramp for entrance   Equipment Recommendations  None recommended by OT    Recommendations for Other Services      Precautions / Restrictions Precautions Precautions: Fall Restrictions Weight Bearing Restrictions: No       Mobility Bed Mobility Overal bed mobility: Modified Independent             General bed mobility comments: HOB elevated    Transfers Overall  transfer level: Needs assistance Equipment used: None Transfers: Sit to/from Stand, Bed to chair/wheelchair/BSC Sit to Stand: Supervision     Step pivot transfers: Supervision     General transfer comment: Pt mildly unsteady leaning on walls at times. No physical assist needed.     Balance Overall balance assessment: Mild deficits observed, not formally tested                                         ADL either performed or assessed with clinical judgement   ADL Overall ADL's : Needs assistance/impaired     Grooming: Supervision/safety;Standing;Wash/dry hands;Wash/dry face Grooming Details (indicate cue type and reason): Pt able to wash hands and face and comb hair while standing at the sink for several minutes without AD.             Lower Body Dressing: Modified independent;Sitting/lateral leans Lower Body Dressing Details (indicate cue type and reason): Able to doff and don socks without difficulty seated at EOB. Toilet Transfer: Supervision/safety;Ambulation Toilet Transfer Details (indicate cue type and reason): Pt amublated to toilet from EOB leaning on walls at times which pt reports is normal. No physical assist needed.                  Cognition Arousal/Alertness: Awake/alert Behavior During Therapy: WFL for tasks assessed/performed Overall Cognitive Status: Within Functional Limits for tasks assessed  Pertinent Vitals/ Pain       Pain Assessment Pain Assessment: No/denies pain                                                          Frequency  Min 1X/week        Progress Toward Goals  OT Goals(current goals can now be found in the care plan section)  Progress towards OT goals: Progressing toward goals  Acute Rehab OT Goals Patient Stated Goal: to go home OT Goal Formulation: With patient Time For Goal Achievement:  12/31/21 Potential to Achieve Goals: Good ADL Goals Pt Will Perform Grooming: with modified independence;standing Pt Will Perform Lower Body Bathing: with modified independence;sitting/lateral leans;sit to/from stand Pt Will Perform Lower Body Dressing: with modified independence;sitting/lateral leans;sit to/from stand Pt Will Transfer to Toilet: with modified independence;ambulating Pt Will Perform Toileting - Clothing Manipulation and hygiene: with modified independence;sitting/lateral leans;sit to/from stand  Plan Discharge plan remains appropriate                                    End of Session Equipment Utilized During Treatment: Oxygen  OT Visit Diagnosis: Unsteadiness on feet (R26.81);Other abnormalities of gait and mobility (R26.89);Muscle weakness (generalized) (M62.81)   Activity Tolerance Patient tolerated treatment well   Patient Left in bed;with call bell/phone within reach;with bed alarm set   Nurse Communication          Time: 6761-9509 OT Time Calculation (min): 11 min  Charges: OT General Charges $OT Visit: 1 Visit OT Treatments $Self Care/Home Management : 8-22 mins  Merlene Dante OT, MOT  Larey Seat 12/20/2021, 9:35 AM

## 2021-12-20 NOTE — Discharge Summary (Signed)
Physician Discharge Summary   Patient: Nathan Turner MRN: [id number] DOB: 04/01/1946  Admit date:     12/14/2021  Discharge date: 12/20/21  Discharge Physician: Barton Dubois   PCP: Glenda Chroman, MD   Recommendations at discharge:  Repeat chest x-ray in 6-8 weeks to assure complete resolution of infiltrates Make sure patient has follow-up with pulmonologist as instructed (in need of PET scan and possible bronchoscopy). Assist patient with smoking cessation Outpatient follow-up with repeat CT or MRI of the chest to follow stability of aneurysmal dilatation of the descending thoracic aorta. Repeat basic metabolic panel to follow electrolytes and renal function Repeat CBC to follow hemoglobin/WBCs trend and instability.  Discharge Diagnoses: Principal Problem:   Acute respiratory failure with hypoxia (HCC) Active Problems:   Altered mental status   Interstitial edema   Leukocytosis   Hypoalbuminemia due to protein-calorie malnutrition (HCC)   Tobacco abuse   Elevated d-dimer   Fall at home, initial encounter   Uncontrolled type 2 diabetes mellitus with hyperglycemia ([place])   Essential hypertension   Mixed hyperlipidemia   Descending aortic aneurysm (HCC)   Multiple pulmonary nodules determined by computed tomography of lung   Acute hypercapnic respiratory failure (HCC)   COPD with acute exacerbation [facility])   Brief Hospital admission course: As per H&P written by Dr. Josephine Cables on 12/14/2021, but briefly: Nathan Turner is a 76 y.o. male with medical history significant of T2DM, essential hypertension, hyperlipidemia, insomnia, tobacco abuse who presents to the emergency department with complaint of altered mental status.  Patient recently lost his wife about 3 weeks ago and was at home with his stepson.  2 days ago, stepson went out of town to take care of some affairs regarding patient and late wife's property, so, patient was left at home by himself.  Patient had a fall at home 2  days ago and sustained an abrasion of the head, stepson called about 4 PM yesterday and the patient was acting is normal self, but today, there was a noticeable change in patient's mental status due to not acting his normal self per patient stepson, so he was taken to the ED for further evaluation and management.  There was no report of chest pain, fever, chills, nausea, vomiting, abdominal pain.   ED Course:  In the emergency department, patient was intermittently tachypneic.  He was noted to be hypoxic with O2 sat of 85% on room air, patient does not use oxygen at baseline.  Supplemental oxygen was provided at 4 LPM with improved O2 sat at 91-92%.  Work-up in the ED showed leukocytosis, normocytic anemia.  BMP showed hyperglycemia, BUN 43, creatinine 1.20, albumin 3.4.  D-dimer 1.34 CT head without contrast showed no CT evidence of acute intracranial abnormality Chest x-ray showed mild interstitial pulmonary edema CT angiogram of chest showed: 1. No pulmonary embolism. 2. Spiculated mixed solid and cystic mass within the right lower lobe suspicious for a primary bronchogenic malignancy. Associated mediastinal and hilar adenopathy is suspicious for nodal metastatic disease though a component of reactive change may be present. 3. Additional spiculated nodule within the right upper lobe suspicious for a metachronous primary or intrapulmonary metastasis. 4. Widespread centrilobular and peribronchial nodularity with more extensive consolidative opacity within the lung bases bilaterally. In the acute setting, this may reflect changes of superimposed atypical infection. Follow-up chest CT with contrast following a conservative therapy in 6 weeks would be helpful in confirming this and re-evaluating in the above findings. Thoracic multi disciplinary referral would  also be helpful for further management. 5. Mild emphysema. 6. Extensive multi-vessel coronary artery calcification. 7. Aneurysmal dilation of the  descending thoracic aorta measuring up to 3.9 cm in diameter.  IV Lasix 20 mg x 1 was given.  Hospitalist was asked to admit patient for further evaluation and management.  Assessment and Plan: Acute respiratory failure with hypoxia-multifactorial with COVID-19 infection -Patient requiring supplemental oxygen to maintain normal O2 sats.  Discharge using 2 L nasal cannula supplementation at rest and up to 4 L on exertion. -Appears to be related to COVID-19 infection with possible superimposed bacterial pneumonia. -Lasix given with concerns for interstitial edema at time of admission. -Patient has been discharged home on steroids tapering, oral antibiotics and 1 more dose to complete day 5 for Paxlovid. -Continue the use of bronchodilator management, as needed antitussive/mucolytic therapy and supportive care. -Patient instructed to continue the use of incentive spirometer, flutter valve, vitamin C, zinc and to wean off oxygen supplementation as tolerated.   Acute encephalopathy secondary to above-resolved -Patient had periods of agitation and confusion for which Haldol was provided while inpatient. -CT head not demonstrating acute intracranial normalities. -Continue oxygen supplementation, supportive care and treatment for pneumonia/COVID infection.   Acute mild interstitial edema -Chest x-ray showed mild interstitial edema -2D echocardiogram with LVEF 50-55% and grade 1 diastolic dysfunction -Healthy diet discussed with patient -No diuretics to be continued currently. -No crackles appreciated on exam. -No lower extremity edema seen. -Patient instructed to follow low-sodium diet.   Right lower lobe mass -CT angiography of chest showed spiculated mixed solid and cystic mass within the right lower lobe suspicious for a primary bronchogenic malignancy. Associated mediastinal and hilar adenopathy is suspicious for nodal metastatic disease. -Appreciate pulmonology evaluation with labs ordered  with noted multiple lung nodules and cavitary lesions, started on Augmentin 8/28 for total 10 days -Will need outpatient follow-up with possible bronchoscopy/PET scan   Elevated D-dimer -D-dimer 1.34, CT angiography of chest showed no pulmonary embolism -Continue DVT prophylaxis while inpatient.   Fall at home -Continue fall precaution and neuro precaution -PT recommendations for home health and walker. -Home health orders in place for PT at discharge.   Thoracic descending aneurysm -CT angiography of chest showed aneurysmal dilation of the descending thoracic aorta measuring up to 3.9 cm in diameter.  -Annual imaging follow-up by CT or MRI recommended by radiologist   Leukocytosis possibly reactive-downtrending -Steroids usage causing confounding factors -Continue to follow WBCs trend -Currently on Augmentin therapy as per recommendations by pulmonology service.   Hypoalbuminemia possibly secondary to mild protein calorie malnutrition -Albumin 3.4, protein supplement to be given   T2DM with hyperglycemia -Adjusted insulin per diabetes coordinator recommendations given elevated CBGs while receiving steroids management. -Hemoglobin A1c 6.4%; patient following diet control as an outpatient. -Sliding scale insulin and Semglee used while inpatient to control CBGs. -Anticipating further improvement/stabilization once steroids therapy discontinued.   Essential hypertension -Continue diltiazem -Heart healthy diet discussed with patient -Stable vital signs appreciated.   Mixed hyperlipidemia -Continue lovastatin and Lovaza   Insomnia -Continue Restoril per home regimen   Tobacco abuse -Patient was counseled on tobacco abuse cessation -Nicotine patch prescribed.  GERD/GI prophylaxis -Continue PPI at discharge.   Consultants: Pulmonology service Procedures performed: See below for x-ray reports. Disposition: Home with home health services. Diet recommendation: Heart healthy  diet.  DISCHARGE MEDICATION: Allergies as of 12/20/2021   No Known Allergies      Medication List     STOP taking these medications  cephALEXin 500 MG capsule Commonly known as: KEFLEX   Moderna COVID-19 Bivalent 50 MCG/0.5ML injection Generic drug: COVID-19 mRNA bivalent vaccine (Moderna)       TAKE these medications    albuterol 108 (90 Base) MCG/ACT inhaler Commonly known as: VENTOLIN HFA Inhale 2 puffs into the lungs every 6 (six) hours as needed for wheezing or shortness of breath.   amoxicillin-clavulanate 875-125 MG tablet Commonly known as: AUGMENTIN Take 1 tablet by mouth every 12 (twelve) hours for 7 days.   ascorbic acid 500 MG tablet Commonly known as: VITAMIN C Take 1 tablet (500 mg total) by mouth daily. Start taking on: December 21, 2021   aspirin EC 81 MG tablet Take 81 mg by mouth every other day.   Cartia XT 120 MG 24 hr capsule Generic drug: diltiazem Take 120 mg by mouth daily.   citalopram 20 MG tablet Commonly known as: CELEXA Take 20 mg by mouth daily.   Fish Oil 1200 MG Caps Take 1,200 mg by mouth daily.   guaiFENesin-dextromethorphan 100-10 MG/5ML syrup Commonly known as: ROBITUSSIN DM Take 5 mLs by mouth every 8 (eight) hours as needed for cough.   lovastatin 20 MG tablet Commonly known as: MEVACOR Take 20 mg by mouth daily.   nicotine 21 mg/24hr patch Commonly known as: NICODERM CQ - dosed in mg/24 hours Place 1 patch (21 mg total) onto the skin daily. Start taking on: December 21, 2021   nirmatrelvir/ritonavir EUA 20 x 150 MG & 10 x 100MG  Tabs Commonly known as: PAXLOVID Take 3 tablets by mouth 2 (two) times daily for 1 dose. Complete 1 more dose of this medication as provided.   pantoprazole 40 MG tablet Commonly known as: PROTONIX Take 1 tablet (40 mg total) by mouth daily. Start taking on: December 21, 2021   predniSONE 20 MG tablet Commonly known as: DELTASONE Take 2 tablets by mouth daily x1 day; then 1  tablet by mouth daily x3 days; then half tablet by mouth daily x3 days and stop prednisone. Start taking on: December 21, 2021   rOPINIRole 0.5 MG tablet Commonly known as: REQUIP Take 0.5 mg by mouth at bedtime. What changed: Another medication with the same name was removed. Continue taking this medication, and follow the directions you see here.   temazepam 15 MG capsule Commonly known as: RESTORIL Take 15 mg by mouth at bedtime.   traMADol 50 MG tablet Commonly known as: ULTRAM Take by mouth every 6 (six) hours as needed.   zinc sulfate 220 (50 Zn) MG capsule Take 1 capsule (220 mg total) by mouth daily. Start taking on: December 21, 2021               Durable Medical Equipment  (From admission, onward)           Start     Ordered   12/20/21 0858  For home use only DME oxygen  Once       Question Answer Comment  Length of Need 12 Months   Mode or (Route) Nasal cannula   Liters per Minute 5   Frequency Continuous (stationary and portable oxygen unit needed)   Oxygen conserving device Yes   Oxygen delivery system Gas      12/20/21 0858            Follow-up Information     [facility].   Specialty: Home Health Services Why: Will contact you to schedule home health visits.  Glenda Chroman, MD. Schedule an appointment as soon as possible for a visit in 2 week(s).   Specialty: Internal Medicine Contact information: [place] [address] [phone]                Discharge Exam: Danley Danker Weights   12/18/21 0528 12/19/21 0348 12/20/21 0422  Weight: 62.4 kg 61.9 kg 62 kg   General exam: Alert, awake, oriented x 3; in no acute distress; able to speak in almost complete full sentences at time of discharge.  Using 2-3 L nasal cannula supplementation at rest; and expressing intermittent episode of productive coughing spells Respiratory system: Positive rhonchi bilaterally; no using accessory muscles.   Good saturation on 2-3 L supplementation. Cardiovascular system:RRR. No rubs or gallops; no JVD. Gastrointestinal system: Abdomen is nondistended, soft and nontender. No organomegaly or masses felt. Normal bowel sounds heard. Central nervous system: Alert and oriented. No focal neurological deficits. Extremities: No cyanosis or clubbing. Skin: No petechiae. Psychiatry: Judgement and insight appear normal. Mood & affect appropriate.    Condition at discharge: Stable and improved.  The results of significant diagnostics from this hospitalization (including imaging, microbiology, ancillary and laboratory) are listed below for reference.   Imaging Studies: ECHOCARDIOGRAM COMPLETE  Result Date: 12/16/2021    ECHOCARDIOGRAM REPORT   Patient Name:   AYRTON MCVAY Date of Exam: 12/16/2021 Medical Rec #:  [id number]        Height:       63.0 in Accession #:    [id number]       Weight:       144.0 lb Date of Birth:  17-Jan-1946         BSA:          1.682 m Patient Age:    1 years         BP:           149/80 mmHg Patient Gender: M                HR:           68 bpm. Exam Location:  [facility] Procedure: 2D Echo, Color Doppler and Cardiac Doppler Indications:    H41.93 Acute diastolic (congestive) heart failure  History:        Patient has no prior history of Echocardiogram examinations.                 Risk Factors:Hypertension and Diabetes.  Sonographer:    Raquel Sarna Senior RDCS Referring Phys: [id number] [place]  1. Left ventricular ejection fraction, by estimation, is 50 to 55%. Left ventricular ejection fraction by PLAX is 51 %. The left ventricle has low normal function. The left ventricle has no regional wall motion abnormalities. Left ventricular diastolic parameters are consistent with Grade I diastolic dysfunction (impaired relaxation).  2. Right ventricular systolic function is mildly reduced. The right ventricular size is normal. Tricuspid regurgitation signal is inadequate for  assessing PA pressure.  3. The mitral valve is grossly normal. Trivial mitral valve regurgitation.  4. The aortic valve is tricuspid. Aortic valve regurgitation is not visualized.  5. The inferior vena cava is dilated in size with <50% respiratory variability, suggesting right atrial pressure of 15 mmHg. Comparison(s): No prior Echocardiogram. FINDINGS  Left Ventricle: Left ventricular ejection fraction, by estimation, is 50 to 55%. Left ventricular ejection fraction by PLAX is 51 %. The left ventricle has low normal function. The left ventricle has no regional wall motion abnormalities.  The left ventricular internal cavity size was normal in size. There is no left ventricular hypertrophy. Left ventricular diastolic parameters are consistent with Grade I diastolic dysfunction (impaired relaxation). Normal left ventricular filling pressure. Right Ventricle: The right ventricular size is normal. No increase in right ventricular wall thickness. Right ventricular systolic function is mildly reduced. Tricuspid regurgitation signal is inadequate for assessing PA pressure. Left Atrium: Left atrial size was normal in size. Right Atrium: Right atrial size was normal in size. Pericardium: There is no evidence of pericardial effusion. Mitral Valve: The mitral valve is grossly normal. Trivial mitral valve regurgitation. Tricuspid Valve: The tricuspid valve is normal in structure. Tricuspid valve regurgitation is not demonstrated. Aortic Valve: The aortic valve is tricuspid. Aortic valve regurgitation is not visualized. Pulmonic Valve: The pulmonic valve was normal in structure. Pulmonic valve regurgitation is not visualized. Aorta: The aortic root is normal in size and structure and the ascending aorta was not well visualized. Venous: The inferior vena cava is dilated in size with less than 50% respiratory variability, suggesting right atrial pressure of 15 mmHg. IAS/Shunts: No atrial level shunt detected by color flow Doppler.   LEFT VENTRICLE PLAX 2D LV EF:         Left            Diastology                ventricular     LV e' medial:    6.85 cm/s                ejection        LV E/e' medial:  8.1                fraction by     LV e' lateral:   9.46 cm/s                PLAX is 51      LV E/e' lateral: 5.9                %. LVIDd:         5.00 cm LVIDs:         3.70 cm LV PW:         0.80 cm LV IVS:        0.90 cm LVOT diam:     2.20 cm LV SV:         82 LV SV Index:   49 LVOT Area:     3.80 cm  RIGHT VENTRICLE RV S prime:     8.81 cm/s TAPSE (M-mode): 1.9 cm LEFT ATRIUM             Index        RIGHT ATRIUM           Index LA diam:        3.40 cm 2.02 cm/m   RA Area:     14.60 cm LA Vol (A2C):   46.6 ml 27.71 ml/m  RA Volume:   35.10 ml  20.87 ml/m LA Vol (A4C):   45.4 ml 27.00 ml/m LA Biplane Vol: 46.3 ml 27.53 ml/m  AORTIC VALVE LVOT Vmax:   97.90 cm/s LVOT Vmean:  67.500 cm/s LVOT VTI:    0.216 m  AORTA Ao Root diam: 3.40 cm MITRAL VALVE MV Area (PHT): 2.33 cm    SHUNTS MV Decel Time: 325 msec    Systemic VTI:  0.22 m MV E velocity: 55.70 cm/s  Systemic Diam: 2.20  cm MV A velocity: 75.00 cm/s MV E/A ratio:  0.74 Lyman Bishop MD Electronically signed by Lyman Bishop MD Signature Date/Time: 12/16/2021/12:16:43 PM    Final    CT Angio Chest Pulmonary Embolism (PE) W or WO Contrast  Result Date: 12/15/2021 CLINICAL DATA:  Pulmonary embolism (PE) suspected, positive D-dimer. Dyspnea on exertion, hypoxia. EXAM: CT ANGIOGRAPHY CHEST WITH CONTRAST TECHNIQUE: Multidetector CT imaging of the chest was performed using the standard protocol during bolus administration of intravenous contrast. Multiplanar CT image reconstructions and MIPs were obtained to evaluate the vascular anatomy. RADIATION DOSE REDUCTION: This exam was performed according to the departmental dose-optimization program which includes automated exposure control, adjustment of the mA and/or kV according to patient size and/or use of iterative reconstruction  technique. CONTRAST:  35mL OMNIPAQUE IOHEXOL 350 MG/ML SOLN COMPARISON:  None Available. FINDINGS: Cardiovascular: Extensive multi-vessel coronary artery calcification. Global cardiac size is within normal limits. No pericardial effusion. Central pulmonary arteries are adequately opacified. No intraluminal filling defect is identified to suggest acute pulmonary embolism. Central pulmonary arteries are of normal caliber. There is dilation of the descending thoracic aorta measuring up to 3.9 cm in diameter in its proximal segment just beyond the takeoff of the left subclavian artery. The descending thoracic aorta measures 3.3 cm in diameter distally at the level of the left atrium. The ascending aorta is of normal caliber. Moderate superimposed mixed atherosclerotic plaque. Mediastinum/Nodes: The visualized thyroid is unremarkable. Multiple mildly enlarged mediastinal and hilar lymph nodes are identified measuring up to 18 mm in short axis diameter within the subcarinal lymph node group. Lungs/Pleura: Mild emphysema There is a spiculated mixed solid and cystic mass within the right lower lobe at axial image # 61/6 suspicious for a primary bronchogenic malignancy. An additional mildly spiculated nodule within the right upper lobe at axial image # 26/6 measures 10 mm x 14 mm and is suspicious for a a metachronous primary or intrapulmonary metastasis. There is superimposed widespread extensive peribronchial and centrilobular nodularity with more extensive consolidative opacity within the lung bases bilaterally. In the acute setting, this may reflect changes of superimposed atypical infection. There is associated bronchial wall thickening in keeping with airway inflammation. No pneumothorax or pleural effusion. Upper Abdomen: No acute abnormality Musculoskeletal: No lytic or blastic bone lesion. No acute bone abnormality. Review of the MIP images confirms the above findings. IMPRESSION: 1. No pulmonary embolism. 2.  Spiculated mixed solid and cystic mass within the right lower lobe suspicious for a primary bronchogenic malignancy. Associated mediastinal and hilar adenopathy is suspicious for nodal metastatic disease though a component of reactive change may be present. 3. Additional spiculated nodule within the right upper lobe suspicious for a metachronous primary or intrapulmonary metastasis. 4. Widespread centrilobular and peribronchial nodularity with more extensive consolidative opacity within the lung bases bilaterally. In the acute setting, this may reflect changes of superimposed atypical infection. Follow-up chest CT with contrast following a conservative therapy in 6 weeks would be helpful in confirming this and re-evaluating in the above findings. Thoracic multi disciplinary referral would also be helpful for further management. 5. Mild emphysema. 6. Extensive multi-vessel coronary artery calcification. 7. Aneurysmal dilation of the descending thoracic aorta measuring up to 3.9 cm in diameter. Recommend annual imaging followup by CTA or MRA. This recommendation follows 2010 ACCF/AHA/AATS/ACR/ASA/SCA/SCAI/SIR/STS/SVM Guidelines for the Diagnosis and Management of Patients with Thoracic Aortic Disease. Circulation.2010; 121: J093-O671. Aortic aneurysm NOS (ICD10-I71.9) Emphysema (ICD10-J43.9). Electronically Signed   By: Fidela Salisbury M.D.   On: 12/15/2021 01:32  CT Head Wo Contrast  Result Date: 12/14/2021 CLINICAL DATA:  Head trauma EXAM: CT HEAD WITHOUT CONTRAST TECHNIQUE: Contiguous axial images were obtained from the base of the skull through the vertex without intravenous contrast. RADIATION DOSE REDUCTION: This exam was performed according to the departmental dose-optimization program which includes automated exposure control, adjustment of the mA and/or kV according to patient size and/or use of iterative reconstruction technique. COMPARISON:  None Available. FINDINGS: Brain: No acute territorial  infarction, hemorrhage or intracranial mass. Atrophy. Patchy white matter hypodensity consistent with chronic small vessel ischemic change. Nonenlarged ventricles Vascular: No hyperdense vessels.  Carotid vascular calcification Skull: Normal. Negative for fracture or focal lesion. Sinuses/Orbits: No acute finding. Other: None IMPRESSION: 1. No CT evidence for acute intracranial abnormality. 2. Atrophy and chronic small vessel ischemic changes of the white matter Electronically Signed   By: Donavan Foil M.D.   On: 12/14/2021 23:12   DG Chest Portable 1 View  Result Date: 12/14/2021 CLINICAL DATA:  New oxygen requirement EXAM: PORTABLE CHEST 1 VIEW COMPARISON:  None Available. FINDINGS: Diffuse mild interstitial opacity. Normal cardiomediastinal contours. No pleural effusion or pneumothorax. IMPRESSION: Mild interstitial pulmonary edema. Electronically Signed   By: Ulyses Jarred M.D.   On: 12/14/2021 22:43    Microbiology: Results for orders placed or performed during the hospital encounter of 12/14/21  SARS Coronavirus 2 by RT PCR (hospital order, performed in [facility] hospital lab) *cepheid single result test* Anterior Nasal Swab     Status: Abnormal   Collection Time: 12/17/21 12:55 PM   Specimen: Anterior Nasal Swab  Result Value Ref Range Status   SARS Coronavirus 2 by RT PCR POSITIVE (A) NEGATIVE Final    Comment: (NOTE) SARS-CoV-2 target nucleic acids are DETECTED  SARS-CoV-2 RNA is generally detectable in upper respiratory specimens  during the acute phase of infection.  Positive results are indicative  of the presence of the identified virus, but do not rule out bacterial infection or co-infection with other pathogens not detected by the test.  Clinical correlation with patient history and  other diagnostic information is necessary to determine patient infection status.  The expected result is negative.  Fact Sheet for Patients:   https://www.patel.info/   Fact  Sheet for Healthcare Providers:   https://hall.com/    This test is not yet approved or cleared by the [place] FDA and  has been authorized for detection and/or diagnosis of SARS-CoV-2 by FDA under an Emergency Use Authorization (EUA).  This EUA will remain in effect (meaning this test can be used) for the duration of  the COVID-19 declaration under Section 564(b)(1)  of the Act, 21 U.S.C. section 360-bbb-3(b)(1), unless the authorization is terminated or revoked sooner.   Performed at [facility], [address], [place], [place] [address]     Labs: CBC: Recent Labs  Lab 12/16/21 0557 12/17/21 0416 12/18/21 0448 12/19/21 0233 12/20/21 0406  WBC 13.1* 13.7* 11.4* 13.6* 13.7*  NEUTROABS  --   --  10.4* 12.4* 12.5*  HGB 12.2* 12.7* 13.1 13.5 12.9*  HCT 36.3* 38.1* 39.6 40.5 40.0  MCV 87.3 87.4 87.4 87.7 87.9  PLT 260 297 301 324 403   Basic Metabolic Panel: Recent Labs  Lab 12/16/21 0557 12/17/21 0416 12/18/21 0448 12/19/21 0233 12/20/21 0406  NA 137 138 136 135 137  K 4.0 3.6 4.3 4.8 4.5  CL 99 95* 95* 95* 100  CO2 29 31 31  34* 32  GLUCOSE 231* 205* 240* 263* 235*  BUN  24* 30* 32* 36* 35*  CREATININE 0.76 0.85 0.79 0.80 0.84  CALCIUM 8.2* 8.4* 8.5* 8.6* 8.4*  MG 2.2 2.1 2.2 2.3 2.4   Liver Function Tests: Recent Labs  Lab 12/14/21 1850 12/15/21 0602 12/18/21 0448 12/19/21 0233 12/20/21 0406  AST 30 36 43* 23 16  ALT 50* 50* 92* 74* 56*  ALKPHOS 87 86 85 79 68  BILITOT 1.0 0.9 0.7 0.5 0.5  PROT 7.3 6.5 6.7 6.4* 5.9*  ALBUMIN 3.4* 2.9* 2.7* 2.7* 2.6*   CBG: Recent Labs  Lab 12/19/21 1616 12/19/21 2113 12/19/21 2215 12/20/21 0742 12/20/21 1203  GLUCAP 142* 208* 246* 220* 306*    Discharge time spent: greater than 30 minutes.  Signed: Barton Dubois, MD [facility] 12/20/2021

## 2021-12-20 NOTE — TOC Transition Note (Signed)
Transition of Care [facility]) - CM/SW Discharge Note   Patient Details  Name: Nathan Turner MRN: [id number] Date of Birth: 1945/10/11  Transition of Care [facility]) CM/SW Contact:  Shade Flood, LCSW Phone Number: 12/20/2021, 11:56 AM   Clinical Narrative:     Pt stable for dc home today per MD. Plan remains for [facility] to offer [facility] services upon dc. Linda at [facility] updated on pt's dc. O2 arranged with [employer] as previously requested by pt. Portable tank at pt's room for dc. Pt educated on calling [place] main office as he is leaving the hospital to coordinate delivery of home O2 setup. Pt verbalized understanding. Attempted to update pt's son though unable to reach. HIPPA compliant voicemail message left requesting return call. RN updated as well.  There are no other TOC needs for dc.  Final next level of care: [place] Barriers to Discharge: Barriers Resolved   Patient Goals and CMS Choice Patient states their goals for this hospitalization and ongoing recovery are:: go home CMS Medicare.gov Compare Post Acute Care list provided to:: Patient Choice offered to / list presented to : Patient  Discharge Placement                       Discharge Plan and Services                DME Arranged: Oxygen DME Agency: [employer] [facility] Date DME Agency Contacted: 12/20/21   Representative spoke with at DME Agency: Caryl Pina HH Arranged: RN, PT [facility] Agency: [place] ([place]) Date HH Agency Contacted: 12/18/21 Time [place]: 0920 Representative spoke with at [place]: [place] Determinants of Health ([place]) Interventions     Readmission Risk Interventions     No data to display

## 2021-12-21 LAB — QUANTIFERON-TB GOLD PLUS (RQFGPL)
QuantiFERON Mitogen Value: 0.47 IU/mL
QuantiFERON Nil Value: 0 IU/mL
QuantiFERON TB1 Ag Value: 0.09 IU/mL
QuantiFERON TB2 Ag Value: 0.02 IU/mL

## 2021-12-21 LAB — QUANTIFERON-TB GOLD PLUS: QuantiFERON-TB Gold Plus: UNDETERMINED — AB

## 2021-12-22 DIAGNOSIS — G9341 Metabolic encephalopathy: Secondary | ICD-10-CM | POA: Diagnosis not present

## 2021-12-22 DIAGNOSIS — R918 Other nonspecific abnormal finding of lung field: Secondary | ICD-10-CM | POA: Diagnosis not present

## 2021-12-22 DIAGNOSIS — J9601 Acute respiratory failure with hypoxia: Secondary | ICD-10-CM | POA: Diagnosis not present

## 2021-12-22 DIAGNOSIS — I10 Essential (primary) hypertension: Secondary | ICD-10-CM | POA: Diagnosis not present

## 2021-12-26 DIAGNOSIS — I10 Essential (primary) hypertension: Secondary | ICD-10-CM | POA: Diagnosis not present

## 2021-12-26 DIAGNOSIS — R5383 Other fatigue: Secondary | ICD-10-CM | POA: Diagnosis not present

## 2021-12-26 DIAGNOSIS — Z299 Encounter for prophylactic measures, unspecified: Secondary | ICD-10-CM | POA: Diagnosis not present

## 2021-12-26 DIAGNOSIS — K30 Functional dyspepsia: Secondary | ICD-10-CM | POA: Diagnosis not present

## 2021-12-26 DIAGNOSIS — J449 Chronic obstructive pulmonary disease, unspecified: Secondary | ICD-10-CM | POA: Diagnosis not present

## 2021-12-26 DIAGNOSIS — Z09 Encounter for follow-up examination after completed treatment for conditions other than malignant neoplasm: Secondary | ICD-10-CM | POA: Diagnosis not present

## 2021-12-26 DIAGNOSIS — E1165 Type 2 diabetes mellitus with hyperglycemia: Secondary | ICD-10-CM | POA: Diagnosis not present

## 2021-12-26 DIAGNOSIS — Z79899 Other long term (current) drug therapy: Secondary | ICD-10-CM | POA: Diagnosis not present

## 2021-12-26 DIAGNOSIS — J9611 Chronic respiratory failure with hypoxia: Secondary | ICD-10-CM | POA: Diagnosis not present

## 2022-01-03 DIAGNOSIS — H35033 Hypertensive retinopathy, bilateral: Secondary | ICD-10-CM | POA: Diagnosis not present

## 2022-01-10 DIAGNOSIS — B351 Tinea unguium: Secondary | ICD-10-CM | POA: Diagnosis not present

## 2022-01-10 DIAGNOSIS — E1142 Type 2 diabetes mellitus with diabetic polyneuropathy: Secondary | ICD-10-CM | POA: Diagnosis not present

## 2022-01-10 DIAGNOSIS — M79676 Pain in unspecified toe(s): Secondary | ICD-10-CM | POA: Diagnosis not present

## 2022-01-10 DIAGNOSIS — L84 Corns and callosities: Secondary | ICD-10-CM | POA: Diagnosis not present

## 2022-01-16 DIAGNOSIS — J9601 Acute respiratory failure with hypoxia: Secondary | ICD-10-CM | POA: Diagnosis not present

## 2022-02-07 DIAGNOSIS — F17210 Nicotine dependence, cigarettes, uncomplicated: Secondary | ICD-10-CM | POA: Diagnosis not present

## 2022-02-07 DIAGNOSIS — Z299 Encounter for prophylactic measures, unspecified: Secondary | ICD-10-CM | POA: Diagnosis not present

## 2022-02-07 DIAGNOSIS — J449 Chronic obstructive pulmonary disease, unspecified: Secondary | ICD-10-CM | POA: Diagnosis not present

## 2022-02-07 DIAGNOSIS — I10 Essential (primary) hypertension: Secondary | ICD-10-CM | POA: Diagnosis not present

## 2022-02-07 DIAGNOSIS — I700 Atherosclerosis of aorta: Secondary | ICD-10-CM | POA: Diagnosis not present

## 2022-02-07 DIAGNOSIS — I4891 Unspecified atrial fibrillation: Secondary | ICD-10-CM | POA: Diagnosis not present

## 2022-03-07 DIAGNOSIS — I4891 Unspecified atrial fibrillation: Secondary | ICD-10-CM | POA: Diagnosis not present

## 2022-03-07 DIAGNOSIS — G4700 Insomnia, unspecified: Secondary | ICD-10-CM | POA: Diagnosis not present

## 2022-03-07 DIAGNOSIS — M159 Polyosteoarthritis, unspecified: Secondary | ICD-10-CM | POA: Diagnosis not present

## 2022-03-07 DIAGNOSIS — I10 Essential (primary) hypertension: Secondary | ICD-10-CM | POA: Diagnosis not present

## 2022-03-07 DIAGNOSIS — F17210 Nicotine dependence, cigarettes, uncomplicated: Secondary | ICD-10-CM | POA: Diagnosis not present

## 2022-03-07 DIAGNOSIS — Z299 Encounter for prophylactic measures, unspecified: Secondary | ICD-10-CM | POA: Diagnosis not present

## 2022-03-07 DIAGNOSIS — Z79899 Other long term (current) drug therapy: Secondary | ICD-10-CM | POA: Diagnosis not present

## 2022-03-21 DIAGNOSIS — E1142 Type 2 diabetes mellitus with diabetic polyneuropathy: Secondary | ICD-10-CM | POA: Diagnosis not present

## 2022-03-21 DIAGNOSIS — B351 Tinea unguium: Secondary | ICD-10-CM | POA: Diagnosis not present

## 2022-03-21 DIAGNOSIS — L84 Corns and callosities: Secondary | ICD-10-CM | POA: Diagnosis not present

## 2022-03-21 DIAGNOSIS — M79676 Pain in unspecified toe(s): Secondary | ICD-10-CM | POA: Diagnosis not present

## 2022-05-03 DIAGNOSIS — F17210 Nicotine dependence, cigarettes, uncomplicated: Secondary | ICD-10-CM | POA: Diagnosis not present

## 2022-05-03 DIAGNOSIS — I10 Essential (primary) hypertension: Secondary | ICD-10-CM | POA: Diagnosis not present

## 2022-05-03 DIAGNOSIS — R109 Unspecified abdominal pain: Secondary | ICD-10-CM | POA: Diagnosis not present

## 2022-05-03 DIAGNOSIS — Z299 Encounter for prophylactic measures, unspecified: Secondary | ICD-10-CM | POA: Diagnosis not present

## 2022-05-03 DIAGNOSIS — E1165 Type 2 diabetes mellitus with hyperglycemia: Secondary | ICD-10-CM | POA: Diagnosis not present

## 2022-05-17 DIAGNOSIS — Z299 Encounter for prophylactic measures, unspecified: Secondary | ICD-10-CM | POA: Diagnosis not present

## 2022-05-17 DIAGNOSIS — E1165 Type 2 diabetes mellitus with hyperglycemia: Secondary | ICD-10-CM | POA: Diagnosis not present

## 2022-05-17 DIAGNOSIS — F17210 Nicotine dependence, cigarettes, uncomplicated: Secondary | ICD-10-CM | POA: Diagnosis not present

## 2022-05-17 DIAGNOSIS — I77810 Thoracic aortic ectasia: Secondary | ICD-10-CM | POA: Diagnosis not present

## 2022-05-17 DIAGNOSIS — I10 Essential (primary) hypertension: Secondary | ICD-10-CM | POA: Diagnosis not present

## 2022-06-04 DIAGNOSIS — L84 Corns and callosities: Secondary | ICD-10-CM | POA: Diagnosis not present

## 2022-06-04 DIAGNOSIS — M79676 Pain in unspecified toe(s): Secondary | ICD-10-CM | POA: Diagnosis not present

## 2022-06-04 DIAGNOSIS — B351 Tinea unguium: Secondary | ICD-10-CM | POA: Diagnosis not present

## 2022-06-04 DIAGNOSIS — E1142 Type 2 diabetes mellitus with diabetic polyneuropathy: Secondary | ICD-10-CM | POA: Diagnosis not present

## 2022-06-19 DIAGNOSIS — E1165 Type 2 diabetes mellitus with hyperglycemia: Secondary | ICD-10-CM | POA: Diagnosis not present

## 2022-07-20 DIAGNOSIS — E1165 Type 2 diabetes mellitus with hyperglycemia: Secondary | ICD-10-CM | POA: Diagnosis not present

## 2022-08-08 DIAGNOSIS — E1142 Type 2 diabetes mellitus with diabetic polyneuropathy: Secondary | ICD-10-CM | POA: Diagnosis not present

## 2022-08-08 DIAGNOSIS — B351 Tinea unguium: Secondary | ICD-10-CM | POA: Diagnosis not present

## 2022-08-08 DIAGNOSIS — L84 Corns and callosities: Secondary | ICD-10-CM | POA: Diagnosis not present

## 2022-08-08 DIAGNOSIS — M79676 Pain in unspecified toe(s): Secondary | ICD-10-CM | POA: Diagnosis not present

## 2022-08-20 DIAGNOSIS — E1165 Type 2 diabetes mellitus with hyperglycemia: Secondary | ICD-10-CM | POA: Diagnosis not present

## 2022-08-28 DIAGNOSIS — F17210 Nicotine dependence, cigarettes, uncomplicated: Secondary | ICD-10-CM | POA: Diagnosis not present

## 2022-08-28 DIAGNOSIS — Z299 Encounter for prophylactic measures, unspecified: Secondary | ICD-10-CM | POA: Diagnosis not present

## 2022-08-28 DIAGNOSIS — K5909 Other constipation: Secondary | ICD-10-CM | POA: Diagnosis not present

## 2022-08-28 DIAGNOSIS — I10 Essential (primary) hypertension: Secondary | ICD-10-CM | POA: Diagnosis not present

## 2022-08-28 DIAGNOSIS — E1165 Type 2 diabetes mellitus with hyperglycemia: Secondary | ICD-10-CM | POA: Diagnosis not present

## 2022-09-20 DIAGNOSIS — E1165 Type 2 diabetes mellitus with hyperglycemia: Secondary | ICD-10-CM | POA: Diagnosis not present

## 2022-10-07 DIAGNOSIS — I10 Essential (primary) hypertension: Secondary | ICD-10-CM | POA: Diagnosis not present

## 2022-10-07 DIAGNOSIS — F17210 Nicotine dependence, cigarettes, uncomplicated: Secondary | ICD-10-CM | POA: Diagnosis not present

## 2022-10-07 DIAGNOSIS — I739 Peripheral vascular disease, unspecified: Secondary | ICD-10-CM | POA: Diagnosis not present

## 2022-10-07 DIAGNOSIS — Z299 Encounter for prophylactic measures, unspecified: Secondary | ICD-10-CM | POA: Diagnosis not present

## 2022-10-07 DIAGNOSIS — Z7189 Other specified counseling: Secondary | ICD-10-CM | POA: Diagnosis not present

## 2022-10-07 DIAGNOSIS — Z0000 Encounter for general adult medical examination without abnormal findings: Secondary | ICD-10-CM | POA: Diagnosis not present

## 2022-10-20 DIAGNOSIS — E7800 Pure hypercholesterolemia, unspecified: Secondary | ICD-10-CM | POA: Diagnosis not present

## 2022-10-20 DIAGNOSIS — F32A Depression, unspecified: Secondary | ICD-10-CM | POA: Diagnosis not present

## 2022-10-20 DIAGNOSIS — E1165 Type 2 diabetes mellitus with hyperglycemia: Secondary | ICD-10-CM | POA: Diagnosis not present

## 2022-11-07 DIAGNOSIS — E1142 Type 2 diabetes mellitus with diabetic polyneuropathy: Secondary | ICD-10-CM | POA: Diagnosis not present

## 2022-11-07 DIAGNOSIS — L84 Corns and callosities: Secondary | ICD-10-CM | POA: Diagnosis not present

## 2022-11-07 DIAGNOSIS — B351 Tinea unguium: Secondary | ICD-10-CM | POA: Diagnosis not present

## 2022-11-07 DIAGNOSIS — M79676 Pain in unspecified toe(s): Secondary | ICD-10-CM | POA: Diagnosis not present

## 2022-11-20 DIAGNOSIS — E1165 Type 2 diabetes mellitus with hyperglycemia: Secondary | ICD-10-CM | POA: Diagnosis not present

## 2022-12-11 DIAGNOSIS — R5383 Other fatigue: Secondary | ICD-10-CM | POA: Diagnosis not present

## 2022-12-11 DIAGNOSIS — E1159 Type 2 diabetes mellitus with other circulatory complications: Secondary | ICD-10-CM | POA: Diagnosis not present

## 2022-12-11 DIAGNOSIS — I10 Essential (primary) hypertension: Secondary | ICD-10-CM | POA: Diagnosis not present

## 2022-12-11 DIAGNOSIS — Z299 Encounter for prophylactic measures, unspecified: Secondary | ICD-10-CM | POA: Diagnosis not present

## 2022-12-11 DIAGNOSIS — I152 Hypertension secondary to endocrine disorders: Secondary | ICD-10-CM | POA: Diagnosis not present

## 2022-12-11 DIAGNOSIS — Z0000 Encounter for general adult medical examination without abnormal findings: Secondary | ICD-10-CM | POA: Diagnosis not present

## 2022-12-11 DIAGNOSIS — E7800 Pure hypercholesterolemia, unspecified: Secondary | ICD-10-CM | POA: Diagnosis not present

## 2022-12-11 DIAGNOSIS — Z79899 Other long term (current) drug therapy: Secondary | ICD-10-CM | POA: Diagnosis not present

## 2022-12-11 DIAGNOSIS — J069 Acute upper respiratory infection, unspecified: Secondary | ICD-10-CM | POA: Diagnosis not present

## 2022-12-21 DIAGNOSIS — E1165 Type 2 diabetes mellitus with hyperglycemia: Secondary | ICD-10-CM | POA: Diagnosis not present

## 2023-01-20 DIAGNOSIS — E1165 Type 2 diabetes mellitus with hyperglycemia: Secondary | ICD-10-CM | POA: Diagnosis not present

## 2023-02-19 DIAGNOSIS — E1165 Type 2 diabetes mellitus with hyperglycemia: Secondary | ICD-10-CM | POA: Diagnosis not present

## 2023-03-18 DIAGNOSIS — J209 Acute bronchitis, unspecified: Secondary | ICD-10-CM | POA: Diagnosis not present

## 2023-03-21 DIAGNOSIS — E1165 Type 2 diabetes mellitus with hyperglycemia: Secondary | ICD-10-CM | POA: Diagnosis not present

## 2023-03-27 DIAGNOSIS — R053 Chronic cough: Secondary | ICD-10-CM | POA: Diagnosis not present

## 2023-03-27 DIAGNOSIS — Z299 Encounter for prophylactic measures, unspecified: Secondary | ICD-10-CM | POA: Diagnosis not present

## 2023-03-27 DIAGNOSIS — I10 Essential (primary) hypertension: Secondary | ICD-10-CM | POA: Diagnosis not present

## 2023-03-27 DIAGNOSIS — J449 Chronic obstructive pulmonary disease, unspecified: Secondary | ICD-10-CM | POA: Diagnosis not present

## 2023-03-27 DIAGNOSIS — E1165 Type 2 diabetes mellitus with hyperglycemia: Secondary | ICD-10-CM | POA: Diagnosis not present

## 2023-03-27 DIAGNOSIS — F17210 Nicotine dependence, cigarettes, uncomplicated: Secondary | ICD-10-CM | POA: Diagnosis not present

## 2023-04-10 DIAGNOSIS — B351 Tinea unguium: Secondary | ICD-10-CM | POA: Diagnosis not present

## 2023-04-10 DIAGNOSIS — L84 Corns and callosities: Secondary | ICD-10-CM | POA: Diagnosis not present

## 2023-04-10 DIAGNOSIS — E1142 Type 2 diabetes mellitus with diabetic polyneuropathy: Secondary | ICD-10-CM | POA: Diagnosis not present

## 2023-04-10 DIAGNOSIS — M79676 Pain in unspecified toe(s): Secondary | ICD-10-CM | POA: Diagnosis not present

## 2023-04-21 DIAGNOSIS — E1165 Type 2 diabetes mellitus with hyperglycemia: Secondary | ICD-10-CM | POA: Diagnosis not present

## 2023-05-27 DIAGNOSIS — J441 Chronic obstructive pulmonary disease with (acute) exacerbation: Secondary | ICD-10-CM | POA: Diagnosis not present

## 2023-05-27 DIAGNOSIS — I739 Peripheral vascular disease, unspecified: Secondary | ICD-10-CM | POA: Diagnosis not present

## 2023-05-27 DIAGNOSIS — Z299 Encounter for prophylactic measures, unspecified: Secondary | ICD-10-CM | POA: Diagnosis not present

## 2023-05-27 DIAGNOSIS — F17210 Nicotine dependence, cigarettes, uncomplicated: Secondary | ICD-10-CM | POA: Diagnosis not present

## 2023-05-27 DIAGNOSIS — I4891 Unspecified atrial fibrillation: Secondary | ICD-10-CM | POA: Diagnosis not present

## 2023-05-27 DIAGNOSIS — D692 Other nonthrombocytopenic purpura: Secondary | ICD-10-CM | POA: Diagnosis not present

## 2023-06-19 DIAGNOSIS — E1142 Type 2 diabetes mellitus with diabetic polyneuropathy: Secondary | ICD-10-CM | POA: Diagnosis not present

## 2023-06-19 DIAGNOSIS — M79676 Pain in unspecified toe(s): Secondary | ICD-10-CM | POA: Diagnosis not present

## 2023-06-19 DIAGNOSIS — B351 Tinea unguium: Secondary | ICD-10-CM | POA: Diagnosis not present

## 2023-06-19 DIAGNOSIS — L84 Corns and callosities: Secondary | ICD-10-CM | POA: Diagnosis not present

## 2023-07-11 DIAGNOSIS — E1165 Type 2 diabetes mellitus with hyperglycemia: Secondary | ICD-10-CM | POA: Diagnosis not present

## 2023-07-11 DIAGNOSIS — Z299 Encounter for prophylactic measures, unspecified: Secondary | ICD-10-CM | POA: Diagnosis not present

## 2023-07-11 DIAGNOSIS — G4700 Insomnia, unspecified: Secondary | ICD-10-CM | POA: Diagnosis not present

## 2023-07-11 DIAGNOSIS — I10 Essential (primary) hypertension: Secondary | ICD-10-CM | POA: Diagnosis not present

## 2023-07-11 DIAGNOSIS — F17210 Nicotine dependence, cigarettes, uncomplicated: Secondary | ICD-10-CM | POA: Diagnosis not present

## 2023-07-11 DIAGNOSIS — M545 Low back pain, unspecified: Secondary | ICD-10-CM | POA: Diagnosis not present

## 2023-07-20 DIAGNOSIS — E1165 Type 2 diabetes mellitus with hyperglycemia: Secondary | ICD-10-CM | POA: Diagnosis not present

## 2023-08-20 DIAGNOSIS — E1165 Type 2 diabetes mellitus with hyperglycemia: Secondary | ICD-10-CM | POA: Diagnosis not present

## 2023-09-20 DIAGNOSIS — E1165 Type 2 diabetes mellitus with hyperglycemia: Secondary | ICD-10-CM | POA: Diagnosis not present

## 2023-10-09 DIAGNOSIS — Z0000 Encounter for general adult medical examination without abnormal findings: Secondary | ICD-10-CM | POA: Diagnosis not present

## 2023-10-09 DIAGNOSIS — I10 Essential (primary) hypertension: Secondary | ICD-10-CM | POA: Diagnosis not present

## 2023-10-09 DIAGNOSIS — J449 Chronic obstructive pulmonary disease, unspecified: Secondary | ICD-10-CM | POA: Diagnosis not present

## 2023-10-09 DIAGNOSIS — I700 Atherosclerosis of aorta: Secondary | ICD-10-CM | POA: Diagnosis not present

## 2023-10-09 DIAGNOSIS — Z7189 Other specified counseling: Secondary | ICD-10-CM | POA: Diagnosis not present

## 2023-10-09 DIAGNOSIS — Z1389 Encounter for screening for other disorder: Secondary | ICD-10-CM | POA: Diagnosis not present

## 2023-10-09 DIAGNOSIS — Z299 Encounter for prophylactic measures, unspecified: Secondary | ICD-10-CM | POA: Diagnosis not present

## 2023-10-09 DIAGNOSIS — F17210 Nicotine dependence, cigarettes, uncomplicated: Secondary | ICD-10-CM | POA: Diagnosis not present

## 2023-11-10 DIAGNOSIS — E1165 Type 2 diabetes mellitus with hyperglycemia: Secondary | ICD-10-CM | POA: Diagnosis not present

## 2023-11-10 DIAGNOSIS — F17210 Nicotine dependence, cigarettes, uncomplicated: Secondary | ICD-10-CM | POA: Diagnosis not present

## 2023-11-10 DIAGNOSIS — J449 Chronic obstructive pulmonary disease, unspecified: Secondary | ICD-10-CM | POA: Diagnosis not present

## 2023-11-10 DIAGNOSIS — I4891 Unspecified atrial fibrillation: Secondary | ICD-10-CM | POA: Diagnosis not present

## 2023-11-10 DIAGNOSIS — I10 Essential (primary) hypertension: Secondary | ICD-10-CM | POA: Diagnosis not present

## 2023-11-10 DIAGNOSIS — Z299 Encounter for prophylactic measures, unspecified: Secondary | ICD-10-CM | POA: Diagnosis not present

## 2023-11-20 DIAGNOSIS — E1165 Type 2 diabetes mellitus with hyperglycemia: Secondary | ICD-10-CM | POA: Diagnosis not present

## 2023-12-15 DIAGNOSIS — F17210 Nicotine dependence, cigarettes, uncomplicated: Secondary | ICD-10-CM | POA: Diagnosis not present

## 2023-12-15 DIAGNOSIS — Z299 Encounter for prophylactic measures, unspecified: Secondary | ICD-10-CM | POA: Diagnosis not present

## 2023-12-15 DIAGNOSIS — R059 Cough, unspecified: Secondary | ICD-10-CM | POA: Diagnosis not present

## 2023-12-15 DIAGNOSIS — J440 Chronic obstructive pulmonary disease with acute lower respiratory infection: Secondary | ICD-10-CM | POA: Diagnosis not present

## 2023-12-20 DIAGNOSIS — E1165 Type 2 diabetes mellitus with hyperglycemia: Secondary | ICD-10-CM | POA: Diagnosis not present

## 2023-12-23 DIAGNOSIS — F17210 Nicotine dependence, cigarettes, uncomplicated: Secondary | ICD-10-CM | POA: Diagnosis not present

## 2023-12-23 DIAGNOSIS — E7800 Pure hypercholesterolemia, unspecified: Secondary | ICD-10-CM | POA: Diagnosis not present

## 2023-12-23 DIAGNOSIS — Z0000 Encounter for general adult medical examination without abnormal findings: Secondary | ICD-10-CM | POA: Diagnosis not present

## 2023-12-23 DIAGNOSIS — I10 Essential (primary) hypertension: Secondary | ICD-10-CM | POA: Diagnosis not present

## 2023-12-23 DIAGNOSIS — Z79899 Other long term (current) drug therapy: Secondary | ICD-10-CM | POA: Diagnosis not present

## 2023-12-23 DIAGNOSIS — R5383 Other fatigue: Secondary | ICD-10-CM | POA: Diagnosis not present

## 2023-12-23 DIAGNOSIS — Z299 Encounter for prophylactic measures, unspecified: Secondary | ICD-10-CM | POA: Diagnosis not present

## 2024-01-07 DIAGNOSIS — R52 Pain, unspecified: Secondary | ICD-10-CM | POA: Diagnosis not present

## 2024-01-07 DIAGNOSIS — I10 Essential (primary) hypertension: Secondary | ICD-10-CM | POA: Diagnosis not present

## 2024-01-07 DIAGNOSIS — Z79899 Other long term (current) drug therapy: Secondary | ICD-10-CM | POA: Diagnosis not present

## 2024-01-07 DIAGNOSIS — F17210 Nicotine dependence, cigarettes, uncomplicated: Secondary | ICD-10-CM | POA: Diagnosis not present

## 2024-01-07 DIAGNOSIS — R413 Other amnesia: Secondary | ICD-10-CM | POA: Diagnosis not present

## 2024-01-07 DIAGNOSIS — Z299 Encounter for prophylactic measures, unspecified: Secondary | ICD-10-CM | POA: Diagnosis not present

## 2024-01-07 DIAGNOSIS — R5383 Other fatigue: Secondary | ICD-10-CM | POA: Diagnosis not present

## 2024-01-07 DIAGNOSIS — R296 Repeated falls: Secondary | ICD-10-CM | POA: Diagnosis not present

## 2024-01-07 DIAGNOSIS — Z23 Encounter for immunization: Secondary | ICD-10-CM | POA: Diagnosis not present

## 2024-01-20 DIAGNOSIS — R410 Disorientation, unspecified: Secondary | ICD-10-CM | POA: Diagnosis not present

## 2024-01-20 DIAGNOSIS — E1165 Type 2 diabetes mellitus with hyperglycemia: Secondary | ICD-10-CM | POA: Diagnosis not present

## 2024-01-20 DIAGNOSIS — I10 Essential (primary) hypertension: Secondary | ICD-10-CM | POA: Diagnosis not present

## 2024-01-20 DIAGNOSIS — Z299 Encounter for prophylactic measures, unspecified: Secondary | ICD-10-CM | POA: Diagnosis not present

## 2024-01-20 DIAGNOSIS — N1831 Chronic kidney disease, stage 3a: Secondary | ICD-10-CM | POA: Diagnosis not present

## 2024-01-21 DIAGNOSIS — R413 Other amnesia: Secondary | ICD-10-CM | POA: Diagnosis not present

## 2024-01-26 DIAGNOSIS — R42 Dizziness and giddiness: Secondary | ICD-10-CM | POA: Diagnosis not present

## 2024-01-29 DIAGNOSIS — G936 Cerebral edema: Secondary | ICD-10-CM | POA: Diagnosis not present

## 2024-01-29 DIAGNOSIS — R9089 Other abnormal findings on diagnostic imaging of central nervous system: Secondary | ICD-10-CM | POA: Diagnosis not present

## 2024-01-29 DIAGNOSIS — R220 Localized swelling, mass and lump, head: Secondary | ICD-10-CM | POA: Diagnosis not present

## 2024-02-02 DIAGNOSIS — R918 Other nonspecific abnormal finding of lung field: Secondary | ICD-10-CM | POA: Diagnosis not present

## 2024-02-02 DIAGNOSIS — K7689 Other specified diseases of liver: Secondary | ICD-10-CM | POA: Diagnosis not present

## 2024-02-02 DIAGNOSIS — R935 Abnormal findings on diagnostic imaging of other abdominal regions, including retroperitoneum: Secondary | ICD-10-CM | POA: Diagnosis not present

## 2024-02-02 DIAGNOSIS — R932 Abnormal findings on diagnostic imaging of liver and biliary tract: Secondary | ICD-10-CM | POA: Diagnosis not present

## 2024-02-02 DIAGNOSIS — R590 Localized enlarged lymph nodes: Secondary | ICD-10-CM | POA: Diagnosis not present

## 2024-02-02 DIAGNOSIS — R634 Abnormal weight loss: Secondary | ICD-10-CM | POA: Diagnosis not present

## 2024-02-03 DIAGNOSIS — I10 Essential (primary) hypertension: Secondary | ICD-10-CM | POA: Diagnosis not present

## 2024-02-03 DIAGNOSIS — C7931 Secondary malignant neoplasm of brain: Secondary | ICD-10-CM | POA: Diagnosis not present

## 2024-02-03 DIAGNOSIS — G4700 Insomnia, unspecified: Secondary | ICD-10-CM | POA: Diagnosis not present

## 2024-02-03 DIAGNOSIS — Z299 Encounter for prophylactic measures, unspecified: Secondary | ICD-10-CM | POA: Diagnosis not present

## 2024-02-03 DIAGNOSIS — E1165 Type 2 diabetes mellitus with hyperglycemia: Secondary | ICD-10-CM | POA: Diagnosis not present

## 2024-02-17 ENCOUNTER — Inpatient Hospital Stay

## 2024-02-17 ENCOUNTER — Inpatient Hospital Stay: Attending: Oncology | Admitting: Oncology

## 2024-02-17 VITALS — BP 89/49 | HR 66 | Temp 97.4°F | Resp 17 | Ht 68.5 in | Wt 159.0 lb

## 2024-02-17 DIAGNOSIS — C801 Malignant (primary) neoplasm, unspecified: Secondary | ICD-10-CM | POA: Insufficient documentation

## 2024-02-17 DIAGNOSIS — Z79899 Other long term (current) drug therapy: Secondary | ICD-10-CM | POA: Insufficient documentation

## 2024-02-17 DIAGNOSIS — R911 Solitary pulmonary nodule: Secondary | ICD-10-CM | POA: Insufficient documentation

## 2024-02-17 DIAGNOSIS — C3490 Malignant neoplasm of unspecified part of unspecified bronchus or lung: Secondary | ICD-10-CM

## 2024-02-17 DIAGNOSIS — C7931 Secondary malignant neoplasm of brain: Secondary | ICD-10-CM | POA: Insufficient documentation

## 2024-02-17 DIAGNOSIS — Z7952 Long term (current) use of systemic steroids: Secondary | ICD-10-CM | POA: Diagnosis not present

## 2024-02-17 DIAGNOSIS — Z720 Tobacco use: Secondary | ICD-10-CM

## 2024-02-17 DIAGNOSIS — Z7984 Long term (current) use of oral hypoglycemic drugs: Secondary | ICD-10-CM | POA: Diagnosis not present

## 2024-02-17 DIAGNOSIS — I10 Essential (primary) hypertension: Secondary | ICD-10-CM | POA: Diagnosis not present

## 2024-02-17 DIAGNOSIS — F17210 Nicotine dependence, cigarettes, uncomplicated: Secondary | ICD-10-CM | POA: Insufficient documentation

## 2024-02-17 NOTE — Progress Notes (Signed)
 Hematology-Oncology Clinic Note  Nathan Leta NOVAK, MD   Reason for Referral: Brain metastasis, probable metastatic lung carcinoma  Oncology History: I have reviewed his chart and materials related to his cancer extensively and collaborated history with the patient. Summary of oncologic history is as follows:  Diagnosis: Probable metastatic lung carcinoma  -12/14/2021: CT head without contrast: No CT evidence for acute intracranial abnormality. -12/15/2021: CT angio chest: Spiculated mixed solid and cystic mass within the right lower lobe suspicious for a primary bronchogenic malignancy. Associated mediastinal and hilar adenopathy is suspicious for nodal metastatic disease though a component of reactive change may be present.Additional spiculated nodule within the right upper lobe suspicious for a metachronous primary or intrapulmonary metastasis. -02/02/2024: CT CAP with contrast: No acute findings in the chest, abdomen or pelvis.  Pulmonary lesions indeterminate but likely synchronous primary malignancies. Irregular nodular consolidation in the superior segment of the right lower lobe (series 13, image 116) measuring 43 x 34 mm. Irregular nodular consolidation in the right apex (image 46) measuring 12 x 10 mm.  Ipsilateral hilar lymphadenopathy, likely metastatic.  Adrenal hepatic lesions indeterminate.Widespread centrilobular and peribronchial nodularity with more extensive consolidative opacity within the lung bases bilaterally. - 01/29/2024: MRI brain without contrast:Multiple supratentorial and infratentorial masses with surrounding vasogenic edema measuring up to 4.6 cm, concerning for metastatic disease. The right parasagittal occipital lobe mass and right posterior cerebellar lesion contain subacute blood products. Partial effacement of the right lateral ventricle without midline shift. Recommend contrast-enhanced brain MRI.    History of Presenting Illness: Nathan Turner 78 y.o. 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  Patient was accompanied by his daughter-in-law today.  Patient reported that he had been feeling dizzy when he stands up from sitting position and recently had a fall and that led him to get this worked up with primary care.  Patient does not remember being told that he has a lung mass in 2023.  He denies any other complaints today.  Denies shortness of breath, hemoptysis, weight loss, loss of appetite.  Patient lives by himself and has lost his wife a couple years ago.  His daughter-in-law and son lives very close by and helps with cleaning and cooking.  Patient spends most of his time in his bed or recliner but is still very functional around the house.  Patient is a chronic smoker, smokes up to 3 packs a day for the past 70 years.  He denies using alcohol.   Medical History: Past Medical History:  Diagnosis Date   Amputation of left index finger    Anxiety    Arthritis    back   Complication of anesthesia    very loopy and confused   Hypertension    Pre-diabetes     Surgical history: Past Surgical History:  Procedure Laterality Date   AMPUTATION Left 11/16/2019   Procedure: REVISION AMPUTATION LEFT INDEX FINGER;  Surgeon: Nathan Drivers, MD;  Location:  SURGERY CENTER;  Service: Orthopedics;  Laterality: Left;   CERVICAL SPINE SURGERY     COLONOSCOPY     TONSILLECTOMY       Allergies:  has no known allergies.  Medications:  Current Outpatient Medications  Medication Sig Dispense Refill   albuterol  (VENTOLIN  HFA) 108 (90 Base) MCG/ACT inhaler Inhale 2 puffs into the lungs every 6 (six) hours as needed for wheezing or shortness of breath. 18 g 1   glipiZIDE-metformin (METAGLIP) 2.5-500 MG tablet Take 1 tablet  by mouth 2 (two) times daily before a meal.     lisinopril (ZESTRIL) 2.5 MG tablet      lovastatin (MEVACOR) 20 MG tablet Take 20 mg by mouth daily.      Omega-3 Fatty Acids  (FISH OIL) 1000 MG CAPS Take 1,200 mg by mouth.     phenytoin (DILANTIN) 100 MG ER capsule Take by mouth.     predniSONE  (DELTASONE ) 20 MG tablet Take 2 tablets by mouth daily x1 day; then 1 tablet by mouth daily x3 days; then half tablet by mouth daily x3 days and stop prednisone . 8 tablet 0   temazepam  (RESTORIL ) 15 MG capsule Take 15 mg by mouth at bedtime.     traMADol  (ULTRAM ) 50 MG tablet Take by mouth every 6 (six) hours as needed.     valsartan (DIOVAN) 80 MG tablet      No current facility-administered medications for this visit.    Review of Systems: Constitutional: Denies fevers, chills or abnormal night sweats Eyes: Denies blurriness of vision, double vision or watery eyes Ears, nose, mouth, throat, and face: Denies mucositis or sore throat Respiratory: Denies cough, dyspnea or wheezes Cardiovascular: Denies palpitation, chest discomfort or lower extremity swelling Gastrointestinal:  Denies nausea, heartburn or change in bowel habits Skin: Denies abnormal skin rashes Lymphatics: Denies new lymphadenopathy or easy bruising Neurological:Denies numbness, tingling or new weaknesses Behavioral/Psych: Mood is stable, no new changes  All other systems were reviewed with the patient and are negative.  Physical Examination: ECOG PERFORMANCE STATUS: 2 - Symptomatic, <50% confined to bed  Vitals:   02/17/24 1106  BP: (!) 89/49  Pulse: 66  Resp: 17  Temp: (!) 97.4 F (36.3 C)  SpO2: 95%   Filed Weights   02/17/24 1106  Weight: 159 lb (72.1 kg)    GENERAL:alert, no distress and comfortable SKIN: skin color, texture, turgor are normal, no rashes or significant lesions LYMPH:  no palpable lymphadenopathy in the cervical, axillary or inguinal LUNGS: clear to auscultation and percussion with normal breathing effort HEART: regular rate & rhythm and no murmurs and no lower extremity edema ABDOMEN:abdomen soft, non-tender and normal bowel sounds Musculoskeletal:no cyanosis of  digits and no clubbing  PSYCH: alert & oriented x 3 with fluent speech NEURO: no focal motor/sensory deficits   Laboratory Data: I have reviewed the data as listed and labs from primary care drawn on 01/07/2024 WBC: 6.7, hemoglobin: 15.4, MCV: 91, platelets: 215 TSH: 0.714 Creatinine: 1.3, GFR: 56, sodium: 141, potassium: 4.7, ALT: 12, AST: 12, bilirubin: 0.3  Lab Results  Component Value Date   WBC 13.7 (H) 12/20/2021   HGB 12.9 (L) 12/20/2021   HCT 40.0 12/20/2021   MCV 87.9 12/20/2021   PLT 319 12/20/2021    Radiographic Studies: Reviewed the radiological reports in care everywhere as listed above   ASSESSMENT & PLAN:  Patient is a 78 y.o. male presenting for probable metastatic lung carcinoma  Probable metastatic lung cancer Patient had a lung nodule in the right lung seen initially in 2023 Recently presented to primary care after a fall, workup with MRI brain without contrast showed possible brain metastasis.  - We discussed in detail the probable diagnosis of metastatic lung cancer and prognosis. - Will refer to pulmonary for EBUS with biopsy of the pulmonary lesion for diagnosis.  Will send Caris testing on this specimen. - Will obtain MRI of brain with contrast - Will send Guardant360 today - Will obtain a PET scan -Will refer to radiation oncology  for possible radiation of brain metastasis. - Will discuss further treatment plan based on all the above results.  Return to clinic 1 week after biopsy to discuss results and further management  Brain metastasis Primary care started patient on 4 mg dexamethasone .  Patient is asymptomatic currently  - Continue dexamethasone  4 mg daily. - Will obtain MRI brain with contrast.  If there is no vasogenic edema, can discontinue dexamethasone  at that time.  Tobacco use Patient is a chronic avid smoker  - Recommended patient about cutting down smoking - Patient is not willing to cut on smoking at this time.    Orders  Placed This Encounter  Procedures   NM PET Image Initial (PI) Skull Base To Thigh    Standing Status:   Future    Expected Date:   02/26/2024    Expiration Date:   02/16/2025    If indicated for the ordered procedure, I authorize the administration of a radiopharmaceutical per Radiology protocol:   Yes    Preferred imaging location?:   [facility]    Release to patient:   Immediate   MR Brain W Wo Contrast    Standing Status:   Future    Expected Date:   02/24/2024    Expiration Date:   02/16/2025    If indicated for the ordered procedure, I authorize the administration of contrast media per Radiology protocol:   Yes    What is the patient's sedation requirement?:   No Sedation    Does the patient have a pacemaker or implanted devices?:   No    Use SRS Protocol?:   No    Preferred imaging location?:   [facility] (table limit - 500lbs)    Release to patient:   Immediate   Miscellaneous test (send-out)    Standing Status:   Future    Number of Occurrences:   1    Expected Date:   02/17/2024    Expiration Date:   02/16/2025    Test name / description::   Guardant 360   Ambulatory referral to Pulmonology    Referral Priority:   Urgent    Referral Type:   Consultation    Referral Reason:   Specialty Services Required    Requested Specialty:   Pulmonary Disease    Number of Visits Requested:   1    The total time spent in the appointment was 60 minutes encounter with patients including review of chart and various tests results, discussions about plan of care and coordination of care plan   All questions were answered. The patient knows to call the clinic with any problems, questions or concerns. No barriers to learning was detected.  Mickiel Dry, MD 10/28/202511:47 PM

## 2024-02-17 NOTE — Patient Instructions (Addendum)
 [facility]  Discharge Instructions  You were seen and examined today by Dr. Davonna. Dr. Davonna is a medical oncologist, meaning that she specializes in the treatment of cancer diagnoses. Dr. Davonna discussed your past medical history, family history of cancers, and the events that led to you being here today.  You were referred to Dr. Davonna today for a new abnormal CT scan and brain MRI that is highly suspicious for lung cancer that has spread to the brain.  Dr. Davonna has recommended a PET scan to see exactly where all is impacted by cancer. Dr. Davonna will also refer you to pulmonology so that they can arrange a biopsy.  Dr. Davonna has also recommended labs today.  Dr. Davonna will see you for follow-up one week after the biopsy.  Thank you for choosing [facility] to provide your oncology and hematology care.   To afford each patient quality time with our provider, please arrive at least 15 minutes before your scheduled appointment time. You may need to reschedule your appointment if you arrive late (10 or more minutes). Arriving late affects you and other patients whose appointments are after yours.  Also, if you miss three or more appointments without notifying the office, you may be dismissed from the clinic at the provider's discretion.    Again, thank you for choosing [facility].  Our hope is that these requests will decrease the amount of time that you wait before being seen by our physicians.   If you have a lab appointment with the Cancer Center - please note that after April 8th, all labs will be drawn in the cancer center.  You do not have to check in or register with the main entrance as you have in the past but will complete your check-in at the cancer center.            _____________________________________________________________  Should you have questions after your visit to [facility], please contact our office at [phone] and follow the prompts.  Our office hours are 8:00 a.m. to 4:30 p.m. Monday - Thursday and 8:00 a.m. to 2:30 p.m. Friday.  Please note that voicemails left after 4:00 p.m. may not be returned until the following business day.  We are closed weekends and all major holidays.  You do have access to a nurse 24-7, just call the main number to the clinic [phone] and do not press any options, hold on the line and a nurse will answer the phone.    For prescription refill requests, have your pharmacy contact our office and allow 72 hours.    Masks are no longer required in the cancer centers. If you would like for your care team to wear a mask while they are taking care of you, please let them know. You may have one support person who is at least 80 years old accompany you for your appointments.

## 2024-02-19 ENCOUNTER — Inpatient Hospital Stay

## 2024-02-19 ENCOUNTER — Other Ambulatory Visit: Payer: Self-pay

## 2024-02-19 ENCOUNTER — Inpatient Hospital Stay: Admitting: Oncology

## 2024-02-19 DIAGNOSIS — C7931 Secondary malignant neoplasm of brain: Secondary | ICD-10-CM

## 2024-02-19 DIAGNOSIS — C3490 Malignant neoplasm of unspecified part of unspecified bronchus or lung: Secondary | ICD-10-CM

## 2024-02-24 ENCOUNTER — Telehealth: Payer: Self-pay

## 2024-02-24 ENCOUNTER — Inpatient Hospital Stay
Admission: RE | Admit: 2024-02-24 | Discharge: 2024-02-24 | Disposition: A | Payer: Self-pay | Source: Ambulatory Visit | Attending: Student in an Organized Health Care Education/Training Program

## 2024-02-24 DIAGNOSIS — Z9289 Personal history of other medical treatment: Secondary | ICD-10-CM

## 2024-02-24 NOTE — Telephone Encounter (Signed)
 Per secure chat, from DR. Isadora harari - this patient is coming to see me on Wednesday, and has a CT scan in the [facility] system from October of this year. Could we port it into our system so I can review by the time he comes to see me? Thanks!

## 2024-02-24 NOTE — Telephone Encounter (Signed)
 Images have been uploaded.

## 2024-02-25 ENCOUNTER — Encounter: Payer: Self-pay | Admitting: Student in an Organized Health Care Education/Training Program

## 2024-02-25 ENCOUNTER — Ambulatory Visit (HOSPITAL_COMMUNITY)
Admission: RE | Admit: 2024-02-25 | Discharge: 2024-02-25 | Disposition: A | Discharge status: Home / Self Care | Source: Ambulatory Visit | Attending: Oncology | Admitting: Oncology

## 2024-02-25 ENCOUNTER — Ambulatory Visit: Admitting: Student in an Organized Health Care Education/Training Program

## 2024-02-25 ENCOUNTER — Telehealth: Payer: Self-pay

## 2024-02-25 VITALS — BP 96/60 | HR 72 | Temp 97.7°F | Ht 68.5 in | Wt 156.6 lb

## 2024-02-25 DIAGNOSIS — R918 Other nonspecific abnormal finding of lung field: Secondary | ICD-10-CM | POA: Diagnosis not present

## 2024-02-25 DIAGNOSIS — C7931 Secondary malignant neoplasm of brain: Secondary | ICD-10-CM

## 2024-02-25 DIAGNOSIS — C3490 Malignant neoplasm of unspecified part of unspecified bronchus or lung: Secondary | ICD-10-CM | POA: Diagnosis present

## 2024-02-25 DIAGNOSIS — F17210 Nicotine dependence, cigarettes, uncomplicated: Secondary | ICD-10-CM

## 2024-02-25 MED ORDER — GADOBUTROL 1 MMOL/ML IV SOLN
7.0000 mL | Freq: Once | INTRAVENOUS | Status: AC | PRN
  Administered 2024-02-25: 7 mL via INTRAVENOUS

## 2024-02-25 NOTE — Patient Instructions (Signed)
  VISIT SUMMARY: Today, we discussed the evaluation of a pulmonary mass in your lung, which is suspected to be metastatic lung cancer. We also reviewed your chronic tobacco use and chronic cough. A PET scan has been ordered to determine the best approach for a biopsy, which is necessary for a definitive diagnosis and to guide treatment options.  YOUR PLAN: -PULMONARY MASS WITH SUSPECTED METASTATIC LUNG CANCER: A pulmonary mass is a growth in the lung, and in your case, it is suspected to be metastatic lung cancer, meaning it may have spread to other parts of your body, including your brain and lymph nodes. We have scheduled a PET scan to find the best site for a biopsy, which will help us  confirm the diagnosis and decide on the best treatment options. Depending on the PET scan results, we may perform a less invasive biopsy or a bronchoscopy under general anesthesia.  -CHRONIC TOBACCO USE: Chronic tobacco use means you have been smoking for a long time, which significantly increases the risk of lung cancer. We discussed the importance of quitting smoking to improve your overall health and reduce further risks.  -CHRONIC COUGH: A chronic cough is a cough that persists over a long period. Your cough occurs mainly at night and does not produce phlegm or blood. While it causes discomfort, it does not significantly affect your daily activities. We will continue to monitor this symptom.  INSTRUCTIONS: Please complete the PET scan as scheduled. We will review the results to determine the best approach for your biopsy. If the PET scan shows an accessible site, we will proceed with a less invasive biopsy. If not, we will schedule a bronchoscopy under general anesthesia. Follow up with us  after the PET scan to discuss the next steps.                      Contains text generated by [employer].

## 2024-02-25 NOTE — Telephone Encounter (Signed)
 Robotic Bronchoscopy W EBUS 03/09/2024  11:00 am R91.1 CPT Code: 68372, O077184, [id number], [id number], [id number], [id number], 68371  [place], please see Bronch info.

## 2024-02-26 ENCOUNTER — Encounter (HOSPITAL_COMMUNITY)
Admission: RE | Admit: 2024-02-26 | Discharge: 2024-02-26 | Disposition: A | Discharge status: Home / Self Care | Source: Ambulatory Visit | Attending: Oncology | Admitting: Oncology

## 2024-02-26 DIAGNOSIS — C3490 Malignant neoplasm of unspecified part of unspecified bronchus or lung: Secondary | ICD-10-CM | POA: Diagnosis present

## 2024-02-26 MED ORDER — FLUDEOXYGLUCOSE F - 18 (FDG) INJECTION
8.4900 | Freq: Once | INTRAVENOUS | Status: AC | PRN
  Administered 2024-02-26: 8.49 via INTRAVENOUS

## 2024-02-26 NOTE — Telephone Encounter (Signed)
 For all the codes 68372, O077184, [id number], [id number], [id number], [id number], [id number], [id number] Prior Auth Not Required Refer # [id number]

## 2024-02-26 NOTE — Telephone Encounter (Signed)
 Noted. Nothing further needed.

## 2024-02-27 ENCOUNTER — Telehealth: Payer: Self-pay

## 2024-02-27 NOTE — Telephone Encounter (Signed)
 Nathan Hose, MD   I have reviewed the patients PET/CT from yesterday, and there aren't any sites that can be biopsied outside the lung. Please call the patient and his son in law that we will proceed with bronchoscopy as scheduled for 11/18.  Thanks [employer] advised as below. NFN.

## 2024-02-27 NOTE — Progress Notes (Signed)
 Assessment & Plan:   1. Lung mass (Primary)  A pulmonary mass in the lower right lung is suspected to be metastatic lung cancer involving the brain and lymph nodes. A recent brain MRI shows multiple lesions, likely metastatic. A PET scan is scheduled to assess for metastasis to other organs that could be amenable to percutaneous biopsy. He does have a RLL mass that was present in 2023 that is likely the primary malignancy, but there is also a RUL nodule that could represent a synchronous primary.  Potential biopsy methods include bronchoscopy under general anesthesia or a less invasive method if the PET scan shows an accessible site. Biopsy results will guide oncological treatment options.   We discussed the importance of diagnosis and staging in lung malignancies, and the approach to obtaining a tissue diagnosis which would include robotic assisted navigational bronchoscopy with endobronchial ultrasound guided sampling.  We also discussed the risks associated with the procedure which include a 2% risk of pneumothorax, infection, bleeding, and nondiagnostic procedure in detail.  I explained that patients typically are able to return home the same day of the procedure, but in rare cases admission to the hospital for observation and treatment is required.  After our discussion, the patient elected to proceed with the procedure  Recommendations:  - CT SUPER D CHEST WO CONTRAST; Future - Procedural/ Surgical Case Request: VIDEO BRONCHOSCOPY WITH ENDOBRONCHIAL NAVIGATION; Future - Review PET/CT Results > did review imaging, no findings of US  or CT accessible lesions for biopsy, will proceed with robotic assisted navigational bronchoscopy.   Belva November, MD [facility]  I spent 60 minutes caring for this patient today, including preparing to see the patient, obtaining a medical history , reviewing a separately obtained history, performing a medically appropriate  examination and/or evaluation, counseling and educating the patient/family/caregiver, ordering medications, tests, or procedures, documenting clinical information in the electronic health record, and independently interpreting results (not separately reported/billed) and communicating results to the patient/family/caregiver  End of visit medications:  No orders of the defined types were placed in this encounter.    Current Outpatient Medications:    albuterol  (VENTOLIN  HFA) 108 (90 Base) MCG/ACT inhaler, Inhale 2 puffs into the lungs every 6 (six) hours as needed for wheezing or shortness of breath., Disp: 18 g, Rfl: 1   glipiZIDE-metformin (METAGLIP) 2.5-500 MG tablet, Take 1 tablet by mouth 2 (two) times daily before a meal., Disp: , Rfl:    lisinopril (ZESTRIL) 2.5 MG tablet, , Disp: , Rfl:    lovastatin (MEVACOR) 20 MG tablet, Take 20 mg by mouth daily. , Disp: , Rfl:    temazepam  (RESTORIL ) 15 MG capsule, Take 15 mg by mouth at bedtime., Disp: , Rfl:    traMADol  (ULTRAM ) 50 MG tablet, Take by mouth every 6 (six) hours as needed., Disp: , Rfl:    Omega-3 Fatty Acids (FISH OIL) 1000 MG CAPS, Take 1,200 mg by mouth. (Patient not taking: Reported on 02/25/2024), Disp: , Rfl:    phenytoin (DILANTIN) 100 MG ER capsule, Take by mouth. (Patient not taking: Reported on 02/25/2024), Disp: , Rfl:    predniSONE  (DELTASONE ) 20 MG tablet, Take 2 tablets by mouth daily x1 day; then 1 tablet by mouth daily x3 days; then half tablet by mouth daily x3 days and stop prednisone ., Disp: 8 tablet, Rfl: 0   valsartan (DIOVAN) 80 MG tablet, , Disp: , Rfl:    Subjective:   PATIENT ID: Nathan Turner GENDER: male DOB: Nov 19, 1945,  MRN: [id number]  Chief Complaint  Patient presents with   Consult    Dry cough.    HPI  Discussed the use of AI scribe software for clinical note transcription with the patient, who gave verbal consent to proceed.  Nathan Turner is an 78 year old male with lung cancer  who presents for evaluation of a pulmonary mass suggestive of metastatic lung cancer.  A CT scan two years ago showed a nodule in the RLL, though the patient does not recall being informed of this finding at the time.   Approximately six weeks ago, he experienced a fall, hitting the back of his head and shoulder blades, which led to dizziness and further falls. This incident prompted further medical evaluation. A recent CT scan of the chest in October has again showed the lung mass in the RLL, with concerning for metastasis. There was also a RUL nodule concerning for metastasis vs a synchronous primary. A brain MRI revealed multiple brain lesions. He was seen by Dr. Davonna from oncology and is referred to us  for consideration of biopsy.  No current breathing symptoms such as shortness of breath, hemoptysis, or chest pain. However, he experiences a cough primarily at night, which does not produce phlegm or blood, and occasionally leads to wheezing. The cough is painful, and he has not found an effective remedy to stop it.  He has a significant smoking history, currently smoking about three packs per day, and has been smoking since the age of [age], averaging two packs per day for the past fifty years. He has at least 100 pack years of smoking history.     Ancillary information including prior medications, full medical/surgical/family/social histories, and PFTs (when available) are listed below and have been reviewed.    Review of Systems  Constitutional:  Negative for chills, fever and weight loss.  Respiratory:  Negative for cough, hemoptysis, sputum production, shortness of breath and wheezing.   Cardiovascular:  Negative for chest pain.  Neurological:  Positive for dizziness.     Objective:   Vitals:   02/25/24 1420  BP: 96/60  Pulse: 72  Temp: 97.7 F (36.5 C)  TempSrc: Temporal  SpO2: 97%  Weight: 156 lb 9.6 oz (71 kg)  Height: 5' 8.5 (1.74 m)   97% on RA  BMI Readings from  Last 3 Encounters:  02/25/24 23.46 kg/m  02/17/24 23.82 kg/m  12/20/21 24.21 kg/m   Wt Readings from Last 3 Encounters:  02/25/24 156 lb 9.6 oz (71 kg)  02/17/24 159 lb (72.1 kg)  12/20/21 136 lb 11 oz (62 kg)    Physical Exam Constitutional:      Appearance: He is ill-appearing.  Cardiovascular:     Rate and Rhythm: Normal rate and regular rhythm.     Pulses: Normal pulses.     Heart sounds: Normal heart sounds.  Pulmonary:     Effort: Pulmonary effort is normal.     Breath sounds: Normal breath sounds.  Neurological:     Mental Status: He is alert.       Ancillary Information    Past Medical History:  Diagnosis Date   Amputation of left index finger    Anxiety    Arthritis    back   Complication of anesthesia    very loopy and confused   Hypertension    Pre-diabetes      No family history on file.   Past Surgical History:  Procedure Laterality Date   AMPUTATION Left  11/16/2019   Procedure: REVISION AMPUTATION LEFT INDEX FINGER;  Surgeon: Murrell Drivers, MD;  Location: [facility];  Service: Orthopedics;  Laterality: Left;   CERVICAL SPINE SURGERY     COLONOSCOPY     TONSILLECTOMY      Social History   Socioeconomic History   Marital status: Married    Spouse name: Not on file   Number of children: Not on file   Years of education: Not on file   Highest education level: Not on file  Occupational History   Not on file  Tobacco Use   Smoking status: Every Day    Current packs/day: 3.00    Average packs/day: 3.0 packs/day for 70.8 years (212.5 ttl pk-yrs)    Types: Cigars, Cigarettes    Start date: 1955   Smokeless tobacco: Never   Tobacco comments:    small cirarellos  Vaping Use   Vaping status: Former  Substance and Sexual Activity   Alcohol use: Never   Drug use: Never   Sexual activity: Yes  Other Topics Concern   Not on file  Social History Narrative   Not on file   Social Drivers of Health   Financial Resource  Strain: Not on file  Food Insecurity: Not on file  Transportation Needs: Not on file  Physical Activity: Not on file  Stress: Not on file  Social Connections: Not on file  Intimate Partner Violence: Not on file     No Known Allergies   CBC    Component Value Date/Time   WBC 13.7 (H) 12/20/2021 0406   RBC 4.55 12/20/2021 0406   HGB 12.9 (L) 12/20/2021 0406   HCT 40.0 12/20/2021 0406   PLT 319 12/20/2021 0406   MCV 87.9 12/20/2021 0406   MCH 28.4 12/20/2021 0406   MCHC 32.3 12/20/2021 0406   RDW 13.7 12/20/2021 0406   LYMPHSABS 0.7 12/20/2021 0406   MONOABS 0.3 12/20/2021 0406   EOSABS 0.0 12/20/2021 0406   BASOSABS 0.0 12/20/2021 0406    Pulmonary Functions Testing Results:     No data to display          Outpatient Medications Prior to Visit  Medication Sig Dispense Refill   albuterol  (VENTOLIN  HFA) 108 (90 Base) MCG/ACT inhaler Inhale 2 puffs into the lungs every 6 (six) hours as needed for wheezing or shortness of breath. 18 g 1   glipiZIDE-metformin (METAGLIP) 2.5-500 MG tablet Take 1 tablet by mouth 2 (two) times daily before a meal.     lisinopril (ZESTRIL) 2.5 MG tablet      lovastatin (MEVACOR) 20 MG tablet Take 20 mg by mouth daily.      temazepam  (RESTORIL ) 15 MG capsule Take 15 mg by mouth at bedtime.     traMADol  (ULTRAM ) 50 MG tablet Take by mouth every 6 (six) hours as needed.     Omega-3 Fatty Acids (FISH OIL) 1000 MG CAPS Take 1,200 mg by mouth. (Patient not taking: Reported on 02/25/2024)     phenytoin (DILANTIN) 100 MG ER capsule Take by mouth. (Patient not taking: Reported on 02/25/2024)     predniSONE  (DELTASONE ) 20 MG tablet Take 2 tablets by mouth daily x1 day; then 1 tablet by mouth daily x3 days; then half tablet by mouth daily x3 days and stop prednisone . 8 tablet 0   valsartan (DIOVAN) 80 MG tablet      No facility-administered medications prior to visit.

## 2024-02-27 NOTE — Progress Notes (Signed)
 [facility] called about patients results of MRI on brain. Gordy stated the stat impressions noted in imaging results. NP Randall Hope notified. Upcoming appointments and medication has already been given to patient. No further actions are needed at this time

## 2024-03-01 ENCOUNTER — Other Ambulatory Visit: Payer: Self-pay | Admitting: *Deleted

## 2024-03-01 MED ORDER — DEXAMETHASONE 4 MG PO TABS: 4.0000 mg | ORAL_TABLET | Freq: Every day | ORAL | 3 refills | Status: AC

## 2024-03-02 ENCOUNTER — Other Ambulatory Visit: Payer: Self-pay | Admitting: *Deleted

## 2024-03-02 MED ORDER — PHENYTOIN SODIUM EXTENDED 100 MG PO CAPS: 100.0000 mg | ORAL_CAPSULE | Freq: Every day | ORAL | 3 refills | Status: AC

## 2024-03-03 ENCOUNTER — Other Ambulatory Visit: Payer: Self-pay

## 2024-03-03 ENCOUNTER — Encounter
Admission: RE | Admit: 2024-03-03 | Discharge: 2024-03-03 | Disposition: A | Discharge status: Home / Self Care | Source: Ambulatory Visit | Attending: Student in an Organized Health Care Education/Training Program | Admitting: Student in an Organized Health Care Education/Training Program

## 2024-03-03 DIAGNOSIS — Z01810 Encounter for preprocedural cardiovascular examination: Secondary | ICD-10-CM

## 2024-03-03 DIAGNOSIS — I719 Aortic aneurysm of unspecified site, without rupture: Secondary | ICD-10-CM

## 2024-03-03 DIAGNOSIS — I10 Essential (primary) hypertension: Secondary | ICD-10-CM

## 2024-03-03 DIAGNOSIS — Z01818 Encounter for other preprocedural examination: Secondary | ICD-10-CM

## 2024-03-03 DIAGNOSIS — J441 Chronic obstructive pulmonary disease with (acute) exacerbation: Secondary | ICD-10-CM

## 2024-03-03 DIAGNOSIS — R918 Other nonspecific abnormal finding of lung field: Secondary | ICD-10-CM

## 2024-03-03 DIAGNOSIS — E119 Type 2 diabetes mellitus without complications: Secondary | ICD-10-CM

## 2024-03-03 HISTORY — DX: Chronic obstructive pulmonary disease, unspecified: J44.9

## 2024-03-03 HISTORY — DX: Mixed hyperlipidemia: E78.2

## 2024-03-03 HISTORY — DX: Nicotine dependence, other tobacco product, uncomplicated: F17.290

## 2024-03-03 HISTORY — DX: Anemia, unspecified: D64.9

## 2024-03-03 HISTORY — DX: Type 2 diabetes mellitus without complications: E11.9

## 2024-03-03 HISTORY — DX: Secondary malignant neoplasm of brain: C79.31

## 2024-03-03 HISTORY — DX: Other nonspecific abnormal finding of lung field: R91.8

## 2024-03-03 HISTORY — DX: Aortic aneurysm of unspecified site, without rupture: I71.9

## 2024-03-03 HISTORY — DX: Gastro-esophageal reflux disease without esophagitis: K21.9

## 2024-03-03 NOTE — Patient Instructions (Addendum)
 Your procedure is scheduled on:03-09-24 Tuesday Report to the Registration Desk on the 1st floor of the [facility].Then proceed to the 2nd floor Surgery Desk To find out your arrival time, please call [phone] between 1PM - 3PM on:03-08-24 Monday If your arrival time is 6:00 am, do not arrive before that time as the [facility] entrance doors do not open until 6:00 am.  REMEMBER: Instructions that are not followed completely may result in serious medical risk, up to and including death; or upon the discretion of your surgeon and anesthesiologist your surgery may need to be rescheduled.  Do not eat food OR drink liquids after midnight the night before surgery.  No gum chewing or hard candies.  One week prior to surgery:Stop NOW (03-03-24) Stop Anti-inflammatories (NSAIDS) such as Advil, Aleve, Ibuprofen, Motrin, Naproxen, Naprosyn and Aspirin  based products such as Excedrin, Goody's Powder, BC Powder. Stop ANY OVER THE COUNTER supplements until after surgery.  You may however, continue to take Tylenol /Tramadol  if needed for pain up until the day of surgery.  Stop glipiZIDE-metformin (METAGLIP) 2 days prior to surgery-Last dose will be on 03-06-24 Saturday  Continue taking all of your other prescription medications up until the day of surgery.  ON THE DAY OF SURGERY ONLY TAKE THESE MEDICATIONS WITH SIPS OF WATER: -dexamethasone  (DECADRON )  -phenytoin (DILANTIN)  -citalopram  (CELEXA )   Bring your Albuterol  Inhaler to the hospital   No Alcohol for 24 hours before or after surgery.  No Smoking including e-cigarettes for 24 hours before surgery.  No chewable tobacco products for at least 6 hours before surgery.  No nicotine  patches on the day of surgery.  Do not use any recreational drugs for at least a week (preferably 2 weeks) before your surgery.  Please be advised that the combination of cocaine and anesthesia may have negative outcomes, up to and including death. If you  test positive for cocaine, your surgery will be cancelled.  On the morning of surgery brush your teeth with toothpaste and water, you may rinse your mouth with mouthwash if you wish. Do not swallow any toothpaste or mouthwash.  Do not wear jewelry, make-up, hairpins, clips or nail polish.  For welded (permanent) jewelry: bracelets, anklets, waist bands, etc.  Please have this removed prior to surgery.  If it is not removed, there is a chance that hospital personnel will need to cut it off on the day of surgery.  Do not wear lotions, powders, or perfumes.   Do not shave body hair from the neck down 48 hours before surgery.  Contact lenses, hearing aids and dentures may not be worn into surgery.  Do not bring valuables to the hospital. [facility] is not responsible for any missing/lost belongings or valuables.   Notify your doctor if there is any change in your medical condition (cold, fever, infection).  Wear comfortable clothing (specific to your surgery type) to the hospital.  After surgery, you can help prevent lung complications by doing breathing exercises.  Take deep breaths and cough every 1-2 hours. Your doctor may order a device called an Incentive Spirometer to help you take deep breaths. When coughing or sneezing, hold a pillow firmly against your incision with both hands. This is called "splinting." Doing this helps protect your incision. It also decreases belly discomfort.  If you are being admitted to the hospital overnight, leave your suitcase in the car. After surgery it may be brought to your room.  In case of increased patient census, it  may be necessary for you, the patient, to continue your postoperative care in the Same Day Surgery department.  If you are being discharged the day of surgery, you will not be allowed to drive home. You will need a responsible individual to drive you home and stay with you for 24 hours after surgery.   If you are taking public  transportation, you will need to have a responsible individual with you.  Please call the Pre-admissions Testing Dept. at [phone] if you have any questions about these instructions.  Surgery Visitation Policy:  Patients having surgery or a procedure may have two visitors.  Children under the age of 31 must have an adult with them who is not the patient.   Merchandiser, Retail to address health-related social needs:  [email or web address]

## 2024-03-04 ENCOUNTER — Encounter
Admission: RE | Admit: 2024-03-04 | Discharge: 2024-03-04 | Disposition: A | Discharge status: Home / Self Care | Source: Ambulatory Visit | Attending: Student in an Organized Health Care Education/Training Program | Admitting: Student in an Organized Health Care Education/Training Program

## 2024-03-04 DIAGNOSIS — J441 Chronic obstructive pulmonary disease with (acute) exacerbation: Secondary | ICD-10-CM | POA: Diagnosis not present

## 2024-03-04 DIAGNOSIS — Z01810 Encounter for preprocedural cardiovascular examination: Secondary | ICD-10-CM | POA: Insufficient documentation

## 2024-03-04 DIAGNOSIS — I719 Aortic aneurysm of unspecified site, without rupture: Secondary | ICD-10-CM | POA: Insufficient documentation

## 2024-03-04 DIAGNOSIS — E119 Type 2 diabetes mellitus without complications: Secondary | ICD-10-CM | POA: Insufficient documentation

## 2024-03-04 DIAGNOSIS — I10 Essential (primary) hypertension: Secondary | ICD-10-CM | POA: Insufficient documentation

## 2024-03-08 MED ORDER — SODIUM CHLORIDE 0.9 % IV SOLN: INTRAVENOUS | Status: DC

## 2024-03-08 MED ORDER — ORAL CARE MOUTH RINSE: 15.0000 mL | Freq: Once | OROMUCOSAL | Status: DC

## 2024-03-08 MED ORDER — CHLORHEXIDINE GLUCONATE 0.12 % MT SOLN: 15.0000 mL | Freq: Once | OROMUCOSAL | Status: DC

## 2024-03-09 ENCOUNTER — Ambulatory Visit

## 2024-03-09 ENCOUNTER — Telehealth: Payer: Self-pay | Admitting: *Deleted

## 2024-03-09 ENCOUNTER — Encounter: Admission: RE | Payer: Self-pay | Source: Home / Self Care

## 2024-03-09 ENCOUNTER — Ambulatory Visit: Admission: RE | Admit: 2024-03-09 | Admitting: Student in an Organized Health Care Education/Training Program

## 2024-03-09 DIAGNOSIS — R918 Other nonspecific abnormal finding of lung field: Secondary | ICD-10-CM | POA: Insufficient documentation

## 2024-03-09 SURGERY — VIDEO BRONCHOSCOPY WITH ENDOBRONCHIAL NAVIGATION
Anesthesia: General | Laterality: Bilateral

## 2024-03-09 NOTE — Telephone Encounter (Signed)
 Spoke with son Auston yesterday regarding wishes to peruse Hospice for his father.  Had conversation with Shona Rigg, NP with [facility], who confirmed admission to in home Hospice care.  All future appointments cancelled.  Dr. Davonna aware.

## 2024-03-17 ENCOUNTER — Inpatient Hospital Stay: Admitting: Oncology

## 2024-03-22 ENCOUNTER — Inpatient Hospital Stay: Admitting: Oncology

## 2024-03-22 ENCOUNTER — Ambulatory Visit: Admitting: Internal Medicine

## 2024-04-22 DEATH — deceased
